# Patient Record
Sex: Female | Born: 1965 | Race: White | Hispanic: No | Marital: Married | Smoking: Never smoker
Health system: Southern US, Community
[De-identification: ages and names within clinical notes are randomized; demographics above are authoritative.]

## PROBLEM LIST (undated history)

## (undated) DIAGNOSIS — I10 Essential (primary) hypertension: Secondary | ICD-10-CM

## (undated) DIAGNOSIS — J309 Allergic rhinitis, unspecified: Secondary | ICD-10-CM

## (undated) DIAGNOSIS — U071 COVID-19: Secondary | ICD-10-CM

## (undated) DIAGNOSIS — E559 Vitamin D deficiency, unspecified: Secondary | ICD-10-CM

## (undated) HISTORY — DX: COVID-19: U07.1

## (undated) HISTORY — DX: Essential (primary) hypertension: I10

## (undated) HISTORY — DX: Allergic rhinitis, unspecified: J30.9

## (undated) HISTORY — PX: JOINT REPLACEMENT: SHX530

## (undated) HISTORY — DX: Vitamin D deficiency, unspecified: E55.9

---

## 1997-10-13 ENCOUNTER — Other Ambulatory Visit: Admission: RE | Admit: 1997-10-13 | Discharge: 1997-10-13 | Payer: Self-pay | Admitting: Obstetrics and Gynecology

## 1998-11-06 ENCOUNTER — Other Ambulatory Visit: Admission: RE | Admit: 1998-11-06 | Discharge: 1998-11-06 | Payer: Self-pay | Admitting: Obstetrics and Gynecology

## 1999-10-06 ENCOUNTER — Emergency Department (HOSPITAL_COMMUNITY): Admission: EM | Admit: 1999-10-06 | Discharge: 1999-10-06 | Payer: Self-pay

## 2000-03-19 ENCOUNTER — Other Ambulatory Visit: Admission: RE | Admit: 2000-03-19 | Discharge: 2000-03-19 | Payer: Self-pay | Admitting: Obstetrics and Gynecology

## 2000-12-15 ENCOUNTER — Other Ambulatory Visit: Admission: RE | Admit: 2000-12-15 | Discharge: 2000-12-15 | Payer: Self-pay | Admitting: Obstetrics and Gynecology

## 2001-12-23 ENCOUNTER — Other Ambulatory Visit: Admission: RE | Admit: 2001-12-23 | Discharge: 2001-12-23 | Payer: Self-pay | Admitting: Obstetrics and Gynecology

## 2002-10-11 ENCOUNTER — Inpatient Hospital Stay (HOSPITAL_COMMUNITY): Admission: AD | Admit: 2002-10-11 | Discharge: 2002-10-16 | Payer: Self-pay | Admitting: Obstetrics and Gynecology

## 2002-11-18 ENCOUNTER — Other Ambulatory Visit: Admission: RE | Admit: 2002-11-18 | Discharge: 2002-11-18 | Payer: Self-pay | Admitting: Obstetrics and Gynecology

## 2004-07-26 ENCOUNTER — Inpatient Hospital Stay (HOSPITAL_COMMUNITY): Admission: AD | Admit: 2004-07-26 | Discharge: 2004-07-26 | Payer: Self-pay | Admitting: Obstetrics & Gynecology

## 2004-08-02 ENCOUNTER — Inpatient Hospital Stay (HOSPITAL_COMMUNITY): Admission: RE | Admit: 2004-08-02 | Discharge: 2004-08-05 | Payer: Self-pay | Admitting: Obstetrics and Gynecology

## 2010-01-24 ENCOUNTER — Inpatient Hospital Stay (HOSPITAL_COMMUNITY): Admission: AD | Admit: 2010-01-24 | Discharge: 2010-01-26 | Payer: Self-pay | Admitting: Obstetrics and Gynecology

## 2010-01-25 ENCOUNTER — Encounter: Payer: Self-pay | Admitting: Obstetrics & Gynecology

## 2010-01-28 ENCOUNTER — Inpatient Hospital Stay (HOSPITAL_COMMUNITY): Admission: AD | Admit: 2010-01-28 | Discharge: 2010-01-28 | Payer: Self-pay | Admitting: Obstetrics and Gynecology

## 2010-02-02 ENCOUNTER — Encounter (INDEPENDENT_AMBULATORY_CARE_PROVIDER_SITE_OTHER): Payer: Self-pay | Admitting: Obstetrics and Gynecology

## 2010-02-02 ENCOUNTER — Inpatient Hospital Stay (HOSPITAL_COMMUNITY): Admission: AD | Admit: 2010-02-02 | Discharge: 2010-02-04 | Payer: Self-pay | Admitting: Obstetrics and Gynecology

## 2010-08-09 LAB — COMPREHENSIVE METABOLIC PANEL
ALT: 18 U/L (ref 0–35)
AST: 22 U/L (ref 0–37)
AST: 23 U/L (ref 0–37)
AST: 18 U/L (ref 0–37)
Albumin: 2.5 g/dL — ABNORMAL LOW (ref 3.5–5.2)
Albumin: 2.9 g/dL — ABNORMAL LOW (ref 3.5–5.2)
Albumin: 3 g/dL — ABNORMAL LOW (ref 3.5–5.2)
Alkaline Phosphatase: 65 U/L (ref 39–117)
BUN: 8 mg/dL (ref 6–23)
CO2: 20 mEq/L (ref 19–32)
CO2: 21 mEq/L (ref 19–32)
CO2: 21 mEq/L (ref 19–32)
Calcium: 8.6 mg/dL (ref 8.4–10.5)
Calcium: 8.9 mg/dL (ref 8.4–10.5)
Chloride: 105 mEq/L (ref 96–112)
Creatinine, Ser: 0.6 mg/dL (ref 0.4–1.2)
Creatinine, Ser: 0.68 mg/dL (ref 0.4–1.2)
Creatinine, Ser: 0.6 mg/dL (ref 0.4–1.2)
GFR calc Af Amer: >60 mL/min (ref >60)
GFR calc Af Amer: >60 mL/min (ref >60)
GFR calc Af Amer: >60 mL/min (ref >60)
GFR calc non Af Amer: >60 mL/min (ref >60)
GFR calc non Af Amer: >60 mL/min (ref >60)
GFR calc non Af Amer: >60 mL/min (ref >60)
Glucose, Bld: 93 mg/dL (ref 70–99)
Potassium: 4 mEq/L (ref 3.5–5.1)
Sodium: 134 mEq/L — ABNORMAL LOW (ref 135–145)
Total Bilirubin: 0.3 mg/dL (ref 0.3–1.2)
Total Bilirubin: 0.4 mg/dL (ref 0.3–1.2)
Total Protein: 5.6 g/dL — ABNORMAL LOW (ref 6.0–8.3)
Total Protein: 5.7 g/dL — ABNORMAL LOW (ref 6.0–8.3)
Total Protein: 6.5 g/dL (ref 6.0–8.3)

## 2010-08-09 LAB — CREATININE CLEARANCE, URINE, 24 HOUR
Collection Interval-CRCL: 24 hours
Creatinine, 24H Ur: 1459 mg/d (ref 700–1800)
Creatinine: 0.6 mg/dL (ref 0.4–1.2)
Urine Total Volume-CRCL: 1950 mL

## 2010-08-09 LAB — CBC
HCT: 31.5 % — ABNORMAL LOW (ref 36.0–46.0)
HCT: 31.8 % — ABNORMAL LOW (ref 36.0–46.0)
Hemoglobin: 11.1 g/dL — ABNORMAL LOW (ref 12.0–15.0)
Hemoglobin: 11.4 g/dL — ABNORMAL LOW (ref 12.0–15.0)
MCH: 31.6 pg (ref 26.0–34.0)
MCH: 32 pg (ref 26.0–34.0)
MCH: 32 pg (ref 26.0–34.0)
MCH: 31.5 pg (ref 26.0–34.0)
MCHC: 34.5 g/dL (ref 30.0–36.0)
MCHC: 34.7 g/dL (ref 30.0–36.0)
MCHC: 34.1 g/dL (ref 30.0–36.0)
MCV: 91 fL (ref 78.0–100.0)
MCV: 91.7 fL (ref 78.0–100.0)
MCV: 92.2 fL (ref 78.0–100.0)
Platelets: 149 10*3/uL — ABNORMAL LOW (ref 150–400)
Platelets: 164 10*3/uL (ref 150–400)
Platelets: 168 10*3/uL (ref 150–400)
Platelets: 206 10*3/uL (ref 150–400)
RBC: 3.53 MIL/uL — ABNORMAL LOW (ref 3.87–5.11)
RDW: 13.1 % (ref 11.5–15.5)
RDW: 13.2 % (ref 11.5–15.5)
RDW: 13.5 % (ref 11.5–15.5)
RDW: 13.1 % (ref 11.5–15.5)
WBC: 8.6 10*3/uL (ref 4.0–10.5)

## 2010-08-09 LAB — URINE MICROSCOPIC-ADD ON

## 2010-08-09 LAB — URIC ACID: Uric Acid, Serum: 7.4 mg/dL — ABNORMAL HIGH (ref 2.4–7.0)

## 2010-08-09 LAB — URINALYSIS, ROUTINE W REFLEX MICROSCOPIC
Glucose, UA: NEGATIVE mg/dL
Leukocytes, UA: NEGATIVE
Protein, ur: NEGATIVE mg/dL
Specific Gravity, Urine: 1.01 (ref 1.005–1.030)
Urobilinogen, UA: 0.2 mg/dL (ref 0.0–1.0)

## 2010-08-09 LAB — PROTEIN, URINE, 24 HOUR
Collection Interval-UPROT: 24 hours
Protein, Urine: 6 mg/dL

## 2010-08-09 LAB — LACTATE DEHYDROGENASE: LDH: 124 U/L (ref 94–250)

## 2010-10-12 NOTE — Discharge Summary (Signed)
   NAME:  LASHAWNDA, HANCOX                             ACCOUNT NO.:  [id number]   MEDICAL RECORD NO.:  [id number]                   PATIENT TYPE:  INP   LOCATION:  9102                                 FACILITY:  [facility]   PHYSICIAN:  Lenoard Aden, M.D.             DATE OF BIRTH:  1965/06/23   DATE OF ADMISSION:  10/12/2002  DATE OF DISCHARGE:  10/16/2002                                 DISCHARGE SUMMARY   HISTORY OF PRESENT ILLNESS:  The patient was admitted for mild preeclampsia  at term, for induction. She was induced and progressed to full dilatation at  which point failure to descend was noted. C-section was performed.  Uncomplicated primary C-section was performed on Oct 13, 2002. Postoperative  course uncomplicated. Hemoglobin 7.9, repeat was stable. The patient is  asymptomatic.   DISPOSITION:  Discharged to home day 3.   DISCHARGE MEDICATIONS:  1. Prenatal vitamins.  2. Iron.  3. Tylox given.   FOLLOW UP:  In the office in 4-6 weeks. Discharge teaching done.                                               Lenoard Aden, M.D.    RJT/MEDQ  D:  11/21/2002  T:  11/21/2002  Job:  [id number]

## 2010-10-12 NOTE — Op Note (Signed)
NAME:  Morgan Gonzalez, Morgan Gonzalez                 ACCOUNT NO.:  [id number]   MEDICAL RECORD NO.:  [id number]          PATIENT TYPE:  INP   LOCATION:  9198                          FACILITY:  [facility]   PHYSICIAN:  Maxie Better, M.D.DATE OF BIRTH:  1966-01-03   DATE OF PROCEDURE:  08/02/2004  DATE OF DISCHARGE:                                 OPERATIVE REPORT   PREOPERATIVE DIAGNOSES:  1.  Previous cesarean section.  2.  Term gestation.   PROCEDURE:  1.  Repeat cesarean section.  2.  Removal of previous keloid scar.   POSTOPERATIVE DIAGNOSES:  1.  Previous cesarean section.  2.  Term gestation.   ANESTHESIA:  Spinal.   SURGEON:  Maxie Better, M.D.   ASSISTANT:  Genia Del, M.D.   DESCRIPTION OF PROCEDURE:  Under adequate spinal anesthesia, the patient was  placed in a supine position with a left lateral tilt.  She was sterilely  prepped and draped in the usual fashion.  An indwelling Foley catheter was  placed.  Marcaine 10 mL of 0.25% was injected along the previous keloid  scar.  Pfannenstiel skin incision was made above the scar, carried down to  the rectus fascia, rectus fascia incised in the midline and extended  bilaterally to the rectus fascia.  Fascia was then bluntly and sharply  dissected off the rectus muscle in superior and inferior fashion.  On the  dissection superiorly, the parietal peritoneum was noted with the rectus  muscle partially separated superiorly.  A careful dissection was then  performed.  The parietal peritoneum was opened superiorly, and the remainder  of the rectus fascia was then bluntly dissected off the rectus muscle  superiorly.  An omental adhesion of the anterior abdominal wall was lysed.  The parietal peritoneum was further opened inferiorly, and the vesicouterine  peritoneum was developed.  The bladder was then bluntly dissected off the  lower uterine segment.  A curvilinear low transverse uterine incision was  then made and extended  bilaterally using bandage scissors.  Artificial  rupture of membranes was performed, clear fluid noted.  Subsequent delivery  of a live female utilizing the vacuum was then accomplished.  Baby was bulb-  suctioned on the abdomen; cord was clamped, cut; the baby was transferred to  the awaiting pediatrician who assigned Apgars of 9 and 10 at one and five  minutes.  The cord was noted to be short.  It was 2 cm on the fetal side and  about  4 inches on the placental side.  The placenta was spontaneous,  intact, not sent to pathology.  Uterine cavity was cleaned of debris.  Uterine incision was without extension and closed in two layers, the first  layer with 0 Monocryl running lock stitch; the second layer was imbricated  using 0 Monocryl suture.  A small bleeder on the right superior aspect of  the incision was hemostased with a figure-of-eight 0 Monocryl suture.  Normal tubes and ovaries were noted bilaterally.  The abdomen was copiously  irrigated, suctioned of debris.  Inspection of the undersurface of the  rectus fascia, small  bleeders cauterized, the rectus muscle area small  bleeder cauterized.  The area from which the omentum was removed was  cauterized for good hemostasis.  With good hemostasis noted, the rectus  fascia was closed with 0 Vicryl x 2.  At that point, the old keloid scar was  then placed on traction using Allises and then removed.  The underlying  subcutaneous area was undermined, subcutaneous areas irrigated and  suctioned, small bleeders cauterized.  The subcutaneous area was then  approximated using 3-0 plain interrupted suture, and then the skin was  approximated using Ethibond staples.  Specimen was placenta not sent.  Estimated blood loss was 600 mL.  Urine output was 250 mL clear-yellow  urine.  Intraoperative fluid was 3 L.  Sponge and instrument counts x 2 was  correct.  Complication was none.  Weight of the baby was 8 pounds 3 ounces.  The patient tolerated the  procedure well and was transferred to recovery  room in stable condition.      [place]/MEDQ  D:  08/02/2004  T:  08/02/2004  Job:  [id number]

## 2010-10-12 NOTE — Op Note (Signed)
NAME:  Morgan Gonzalez, Morgan Gonzalez                             ACCOUNT NO.:  [id number]   MEDICAL RECORD NO.:  [id number]                   PATIENT TYPE:  INP   LOCATION:  9174                                 [facility]ACILITY:  [facility]   PHYSICIAN:  Lenoard Aden, M.D.             DATE OF BIRTH:  1966-04-09   DATE OF PROCEDURE:  10/13/2002  DATE OF DISCHARGE:                                 OPERATIVE REPORT   PREOPERATIVE DIAGNOSES:  Failure to descend, 39 week intrauterine pregnancy,  gestational hypertension.   POSTOPERATIVE DIAGNOSES:  Failure to descend,  39 week intrauterine  pregnancy, gestational hypertension.   PROCEDURE:  Primary lower segment transverse cesarean section   SURGEON:  Lenoard Aden, M.D.   ASSISTANT:  Cordelia Pen A. Rosalio Macadamia, M.D.   ESTIMATED BLOOD LOSS:  500 cc.   COMPLICATIONS:  None.   DRAINS:  Foley.   COUNTS:  Correct.   Patient went to recovery in good condition.   FINDINGS:  Full term living female, occiput posterior position, Apgars 8 and  9, placenta manually intact, three-vessel cord, normal tubes and ovaries.   DESCRIPTION OF PROCEDURE:  The patient was taken to the operating room and  after being apprised of risks of anesthesia, infection, bleeding,  intraabdominal injury, need for repair, the patient was brought to the  operating room where she was administered epidural anesthetic without  complications, prepped and draped in usual sterile fashion.  Foley catheter  placed.  After achieving adequate anesthesia a Pfannenstiel skin incision  made after placement of a dilute Marcaine solution, carried down to the  fascia which was nicked in the midline, opened transversely using Mayo  scissors.  Rectus muscles entered sharply in midline.  Peritoneum entered  sharply and bladder blade placed.  Distal peritoneum was scored in a smile-  like fashion, uterus scored in a smile-like fashion.  Atraumatic delivery  from an occiput posterior position of a full term  living female, Apgars 8 and  9, handed to the pediatrician in attendance.  Cord blood collected.  Placenta delivered manually intact.  Uterus exteriorized.  Normal tubes and  ovaries.  Uterus curetted using dry lap pack, closed in two layers using 0  Monocryl suture.  Good hemostasis achieved.  Bladder flap inspected.  Pericolic gutters irrigated with all blood clots subsequently removed.  Good  hemostasis noted.  Nesacaine solution, 2%, is placed intra-abdominally per  request of Anesthesia,  approximately 10 cc.  At this time good hemostasis was noted.  Fascia was  closed using 0 Monocryl in continuous running fashion.  Skin closed using  staples.  The patient tolerated the procedure well and was to recovery in  good condition.  Lenoard Aden, M.D.    RJT/MEDQ  D:  10/13/2002  T:  10/13/2002  Job:  [id number]

## 2010-10-12 NOTE — Discharge Summary (Signed)
NAME:  Morgan Gonzalez, Morgan Gonzalez                 ACCOUNT NO.:  [id number]   MEDICAL RECORD NO.:  [id number]          PATIENT TYPE:  INP   LOCATION:  9137                          [facility]ACILITY:  [facility]   PHYSICIAN:  Maxie Better, M.D.DATE OF BIRTH:  1965-06-10   DATE OF ADMISSION:  08/02/2004  DATE OF DISCHARGE:  08/05/2004                                 DISCHARGE SUMMARY   ADMISSION DIAGNOSES:  1.  Previous cesarean section.  2.  Term gestation.  3.  Keloid scar.   DISCHARGE DIAGNOSES:  1.  Term gestation, delivered.  2.  Repeat cesarean section.  3.  Removal of a keloid scar.  4.  Postoperative anemia.   PROCEDURE:  Repeat cesarean section, keloid scar removal.   HISTORY OF PRESENT ILLNESS:  A 45 year old gravida 2, para 1-0-0-1, with a  previous cesarean section, admitted at term for repeat cesarean section.   HOSPITAL COURSE:  The patient was admitted to [facility].  She was  taken to the operating room where she underwent a repeat cesarean section,  resulted in an 8 pound 3 ounce female infant, Apgars of 9 and 10.  Findings at  the time of surgery were normal tubes and ovaries, omental adhesions that  were lysed.  Postoperative course was notable for early cellulitis for which  she was placed on Keflex after the incision had been probed with a sterile Q-  tip.  By postoperative day #3, the incision had some mild ecchymosis, no  evidence of infection.  She was tolerating a regular diet.  She had some  elevation of her blood pressure for which PIH labs were drawn which were  unremarkable.  Her CBC on postoperative day #1 had shown a white count of  9.7, hemoglobin 12.1.  She was deemed well to be discharged home.   DISPOSITION:  Home.   DISCHARGE MEDICATIONS:  1.  Keflex 500 mg one p.o. q.6h for 7 days.  2.  Dilaudid one to two tablets every 3 to 4 hours p.r.n. pain.  3.  Prenatal vitamins one p.o. daily.   FOLLOW UP:  Follow-up appointment is at [facility] in a weeks' time  and  6 weeks postpartum.   DISCHARGE INSTRUCTIONS:  Discharge instructions per postpartum booklet  given.      Leslie/MEDQ  D:  08/26/2004  T:  08/27/2004  Job:  [id number]

## 2011-01-24 IMAGING — US US OB COMP +14 WK
1 series · 18 of 28 positions shown · non-contrast
Comparison: none

OBSTETRICAL ULTRASOUND:
 This ultrasound was performed in The [HOSPITAL], and the [facility] report will be stored to [REDACTED] PACS.  This report is also available in [HOSPITAL]?s accessANYware.

[Series 1: us ob comp +14 wk · 46 acquisitions, 18 frames shown]
[im 1/46]
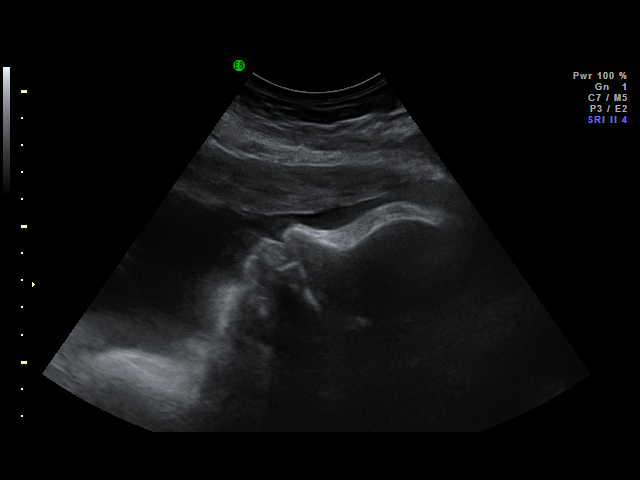
[im 4/46]
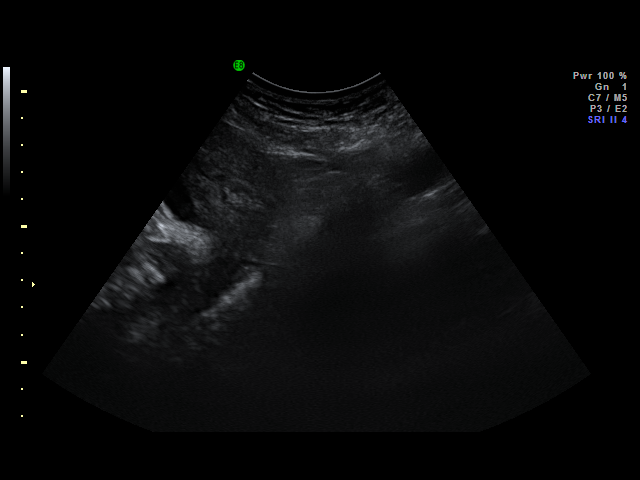
[im 6/46]
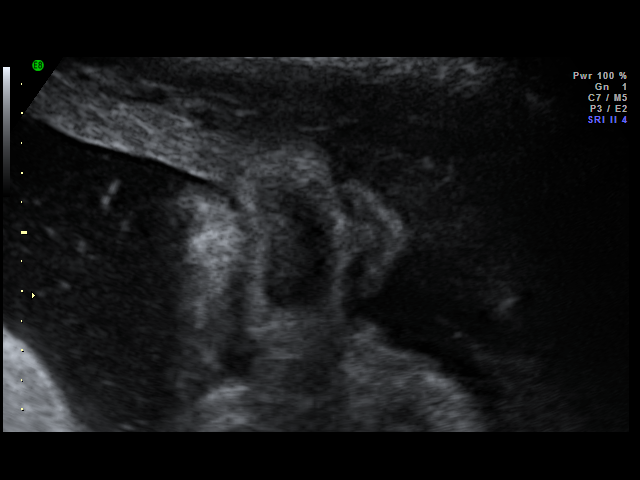
[im 9/46]
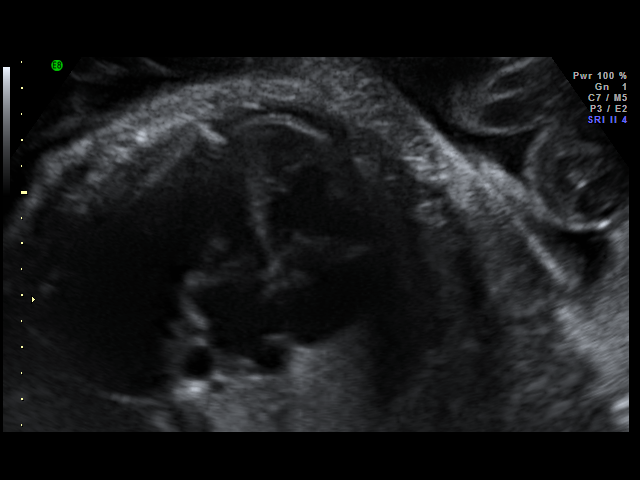
[im 12/46]
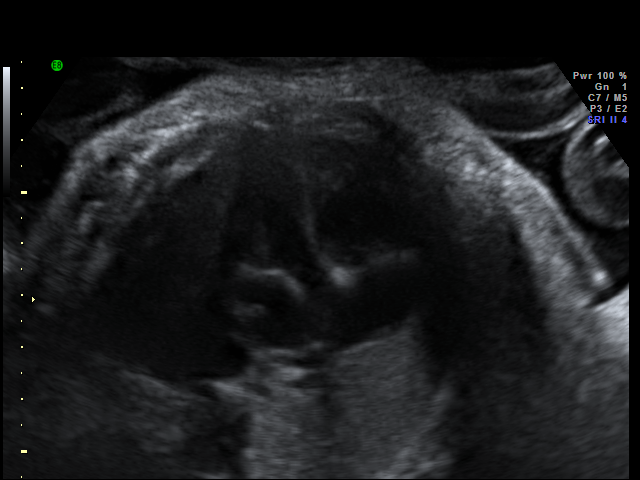
[im 14/46]
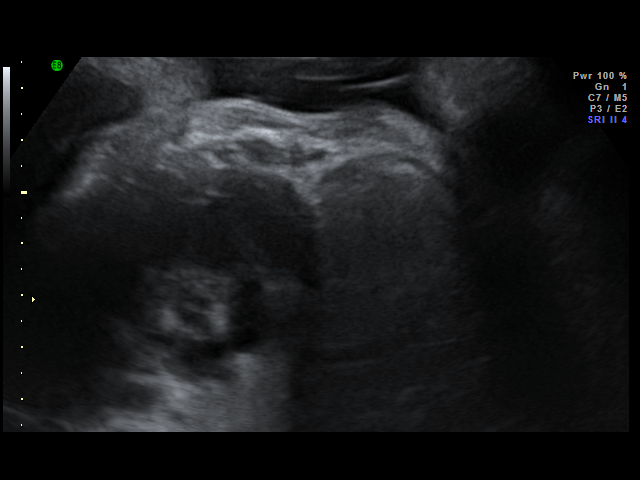
[im 17/46]
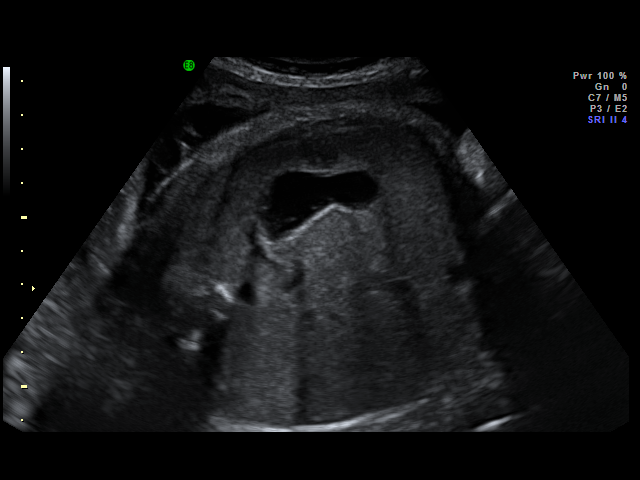
[im 19/46]
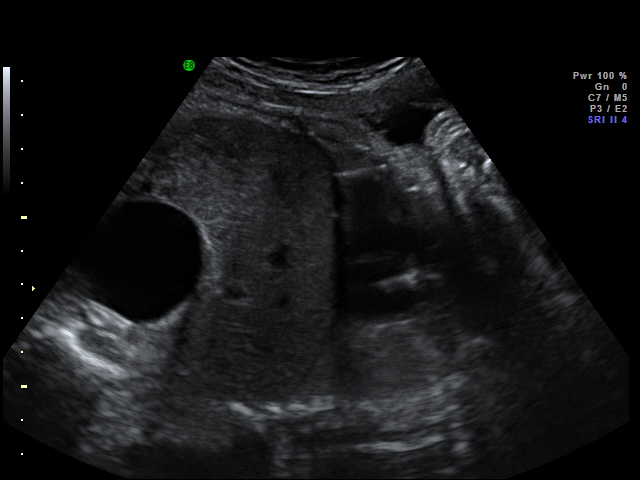
[im 22/46]
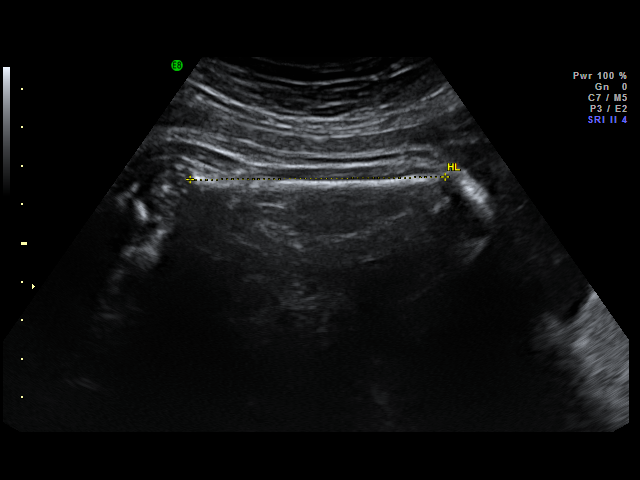
[im 24/46]
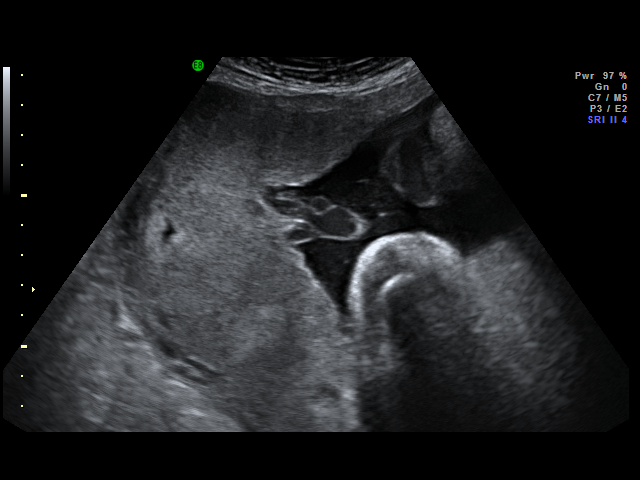
[im 27/46]
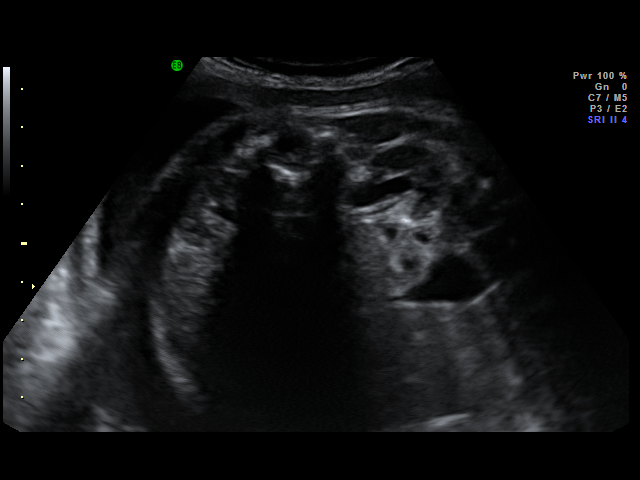
[im 29/46]
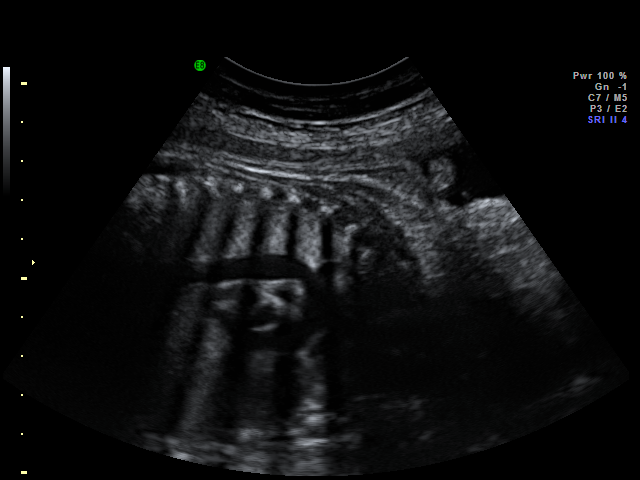
[im 32/46]
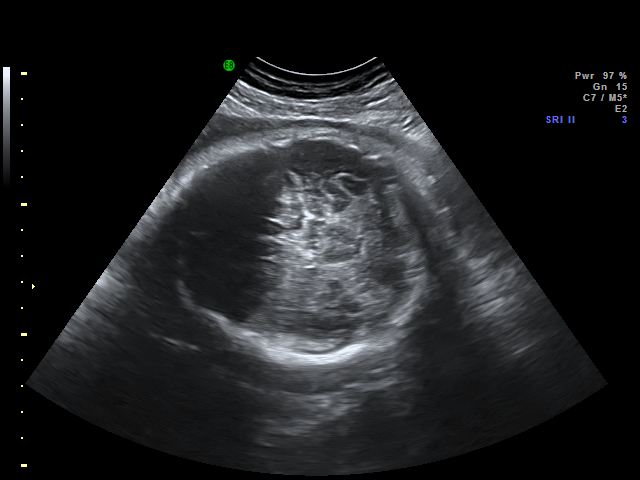
[im 36/46]
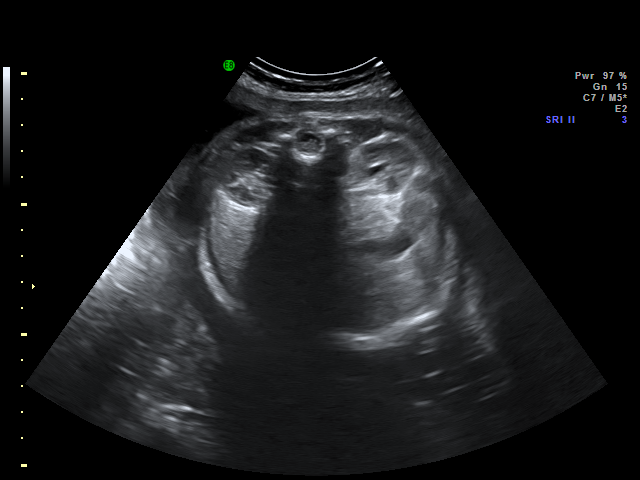
[im 37/46]
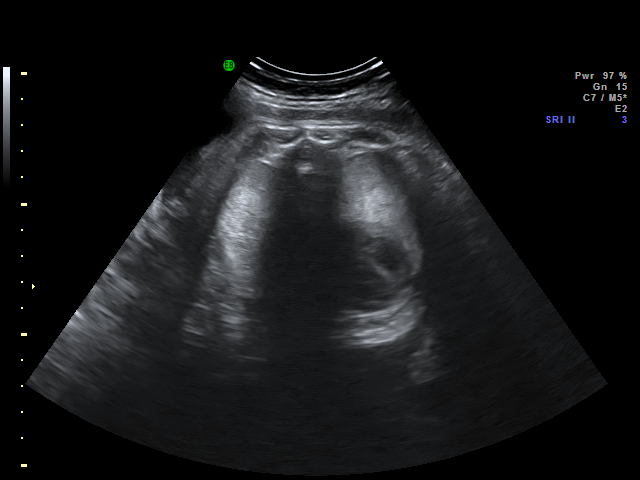
[im 41/46]
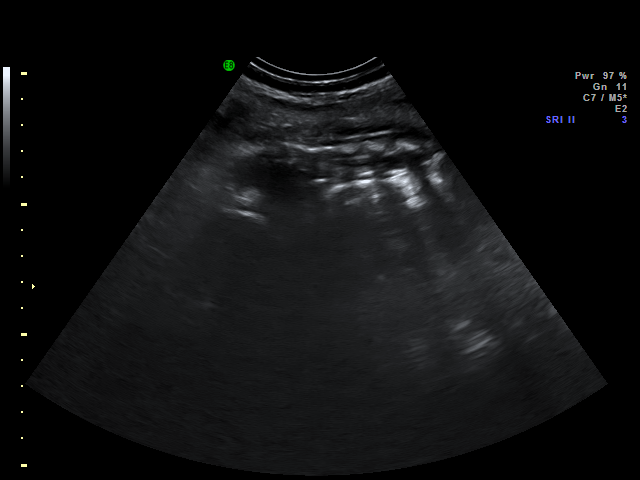
[im 42/46]
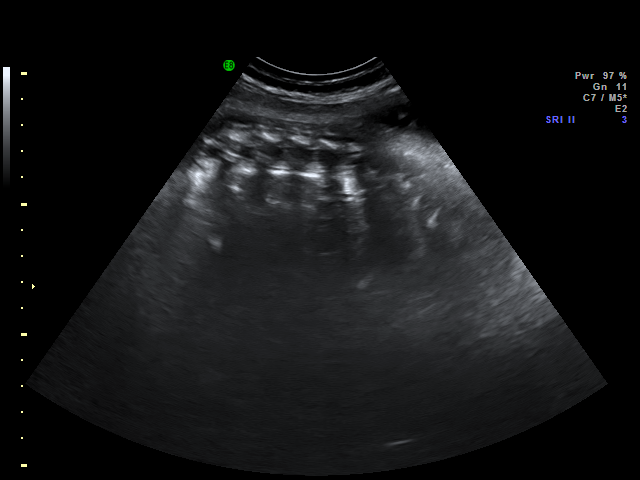
[im 46/46]
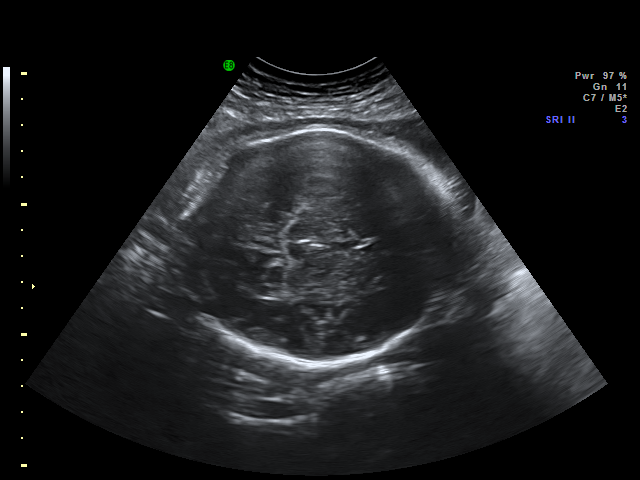

[18 of 28 positions shown; findings below may reference images not displayed]

IMPRESSION: [facility] has also been faxed to the ordering physician.

## 2015-01-13 ENCOUNTER — Encounter: Payer: Self-pay | Admitting: Family Medicine

## 2015-01-13 ENCOUNTER — Ambulatory Visit: Payer: Self-pay | Admitting: Family Medicine

## 2015-01-13 ENCOUNTER — Ambulatory Visit (INDEPENDENT_AMBULATORY_CARE_PROVIDER_SITE_OTHER): Payer: Managed Care, Other (non HMO) | Admitting: Family Medicine

## 2015-01-13 ENCOUNTER — Other Ambulatory Visit (HOSPITAL_COMMUNITY)
Admission: RE | Admit: 2015-01-13 | Discharge: 2015-01-13 | Disposition: A | Discharge status: Home / Self Care | Payer: Managed Care, Other (non HMO) | Source: Ambulatory Visit | Attending: Family Medicine | Admitting: Family Medicine

## 2015-01-13 VITALS — BP 124/82 | HR 100 | Temp 98.5°F | Ht 63.35 in | Wt 171.8 lb

## 2015-01-13 DIAGNOSIS — Z13220 Encounter for screening for lipoid disorders: Secondary | ICD-10-CM

## 2015-01-13 DIAGNOSIS — N760 Acute vaginitis: Secondary | ICD-10-CM | POA: Insufficient documentation

## 2015-01-13 DIAGNOSIS — IMO0001 Reserved for inherently not codable concepts without codable children: Secondary | ICD-10-CM

## 2015-01-13 DIAGNOSIS — Z113 Encounter for screening for infections with a predominantly sexual mode of transmission: Secondary | ICD-10-CM | POA: Insufficient documentation

## 2015-01-13 DIAGNOSIS — N9489 Other specified conditions associated with female genital organs and menstrual cycle: Secondary | ICD-10-CM

## 2015-01-13 DIAGNOSIS — J3489 Other specified disorders of nose and nasal sinuses: Secondary | ICD-10-CM

## 2015-01-13 DIAGNOSIS — R102 Pelvic and perineal pain: Secondary | ICD-10-CM | POA: Diagnosis not present

## 2015-01-13 DIAGNOSIS — G4710 Hypersomnia, unspecified: Secondary | ICD-10-CM

## 2015-01-13 DIAGNOSIS — R0981 Nasal congestion: Secondary | ICD-10-CM

## 2015-01-13 DIAGNOSIS — R030 Elevated blood-pressure reading, without diagnosis of hypertension: Secondary | ICD-10-CM

## 2015-01-13 LAB — POCT URINALYSIS DIPSTICK
Bilirubin, UA: NEGATIVE
GLUCOSE UA: NEGATIVE
KETONES UA: NEGATIVE
Leukocytes, UA: NEGATIVE
Nitrite, UA: NEGATIVE
SPEC GRAV UA: 1.02
UROBILINOGEN UA: 0.2
pH, UA: 5.5

## 2015-01-13 LAB — COMPREHENSIVE METABOLIC PANEL
ALK PHOS: 64 U/L (ref 39–117)
ALT: 57 U/L — AB (ref 0–35)
AST: 20 U/L (ref 0–37)
Albumin: 4.1 g/dL (ref 3.5–5.2)
BILIRUBIN TOTAL: 0.4 mg/dL (ref 0.2–1.2)
BUN: 11 mg/dL (ref 6–23)
CALCIUM: 9.6 mg/dL (ref 8.4–10.5)
CO2: 29 mEq/L (ref 19–32)
Chloride: 104 mEq/L (ref 96–112)
Creatinine, Ser: 0.67 mg/dL (ref 0.40–1.20)
GFR: 100.1 mL/min (ref >60.00)
GLUCOSE: 81 mg/dL (ref 70–99)
POTASSIUM: 5 meq/L (ref 3.5–5.1)
Sodium: 140 mEq/L (ref 135–145)
TOTAL PROTEIN: 7.4 g/dL (ref 6.0–8.3)

## 2015-01-13 LAB — FOLLICLE STIMULATING HORMONE: FSH: 13.3 m[IU]/mL

## 2015-01-13 LAB — CBC
HCT: 43.3 % (ref 36.0–46.0)
HEMOGLOBIN: 14.7 g/dL (ref 12.0–15.0)
MCHC: 33.9 g/dL (ref 30.0–36.0)
MCV: 87.2 fl (ref 78.0–100.0)
PLATELETS: 282 10*3/uL (ref 150.0–400.0)
RBC: 4.97 Mil/uL (ref 3.87–5.11)
RDW: 12.6 % (ref 11.5–15.5)
WBC: 10.5 10*3/uL (ref 4.0–10.5)

## 2015-01-13 LAB — LIPID PANEL
Cholesterol: 205 mg/dL — ABNORMAL HIGH (ref 0–200)
HDL: 46.7 mg/dL (ref >39.00)
NONHDL: 158.67
Total CHOL/HDL Ratio: 4
Triglycerides: 206 mg/dL — ABNORMAL HIGH (ref 0.0–149.0)
VLDL: 41.2 mg/dL — ABNORMAL HIGH (ref 0.0–40.0)

## 2015-01-13 LAB — URINALYSIS, MICROSCOPIC ONLY

## 2015-01-13 LAB — POCT URINE PREGNANCY: PREG TEST UR: NEGATIVE

## 2015-01-13 LAB — LDL CHOLESTEROL, DIRECT: LDL DIRECT: 135 mg/dL

## 2015-01-13 MED ORDER — MOMETASONE FUROATE 50 MCG/ACT NA SUSP
NASAL | Status: DC
Start: 2015-01-13 — End: 2018-12-10

## 2015-01-13 NOTE — Progress Notes (Signed)
Pre visit review using our clinic review tool, if applicable. No additional management support is needed unless otherwise documented below in the visit note. 

## 2015-01-13 NOTE — Patient Instructions (Signed)
Nice to see you. We will check some blood work and call with the results.  We will start you on nasonex for nasal congestion. Please use nasal saline as well. If you have recurrence of the vertigo or if you develop headache, numbness, weakness, vision changes, fever, neck pain, shortness of breath or chest pain please seek medical attention.  If you develop abdominal pain, nausea, vomiting, or diarrhea please seek medical attention.  We will send you for a sleep study as well.

## 2015-01-13 NOTE — Progress Notes (Signed)
Patient ID: Morgan Gonzalez, female   DOB: 04/03/1966, 49 y.o.   MRN: [id number]  Tommi Rumps, MD Phone: [phone]  Morgan Gonzalez is a 49 y.o. female who presents today for new patient visit.  Sinus pressure: patient notes Wednesday she started with sinus congestion. Congestion is described as pressure in maxillary sinuses. Notes sore throat as well. Had episode of vertigo with change in position with this on Wednesday as well. Notes that her vision went back and forth with this vertigo sensation. No blurry vision. Notes vertigo and shaking vision lasted for 2 minutes immediately after laying down and then resolved on its own with staying in the same position. No vision issues after vertigo stopped. Notes conitnued sinus pressure. No more vertigo. No additional vision issues. No weakness or numbness. No fevers. No neck pain or stiffness. Notes this feels like her typical sinus issues.  Nasal congestion while sleeping: notes that she has trouble breathing out of her nose only when she is laying down while asleep. Notes that her nose feels congested with this. Is able to breath out of mouth with this, though minimally congested with this. No dyspnea, no orthopnea, no chest pain, no dyspnea on exertion. Notes she snores. Denies persistent LE edema. No witnessed apnea. Reports not being well rested in the morning. Will doze off in front of TV, though not in the car. Has history of allergic rhinitis and previously used nasal steroid though stopped this. No rhinorrhea.   Missed period: patient reports that she has been late for her cycle the past 2 months. Last month she was 8 days late. Now it has been 23 days since her last period. Notes some hot flashes previously though none recently. Notes mild pressure sensation in bilateral lower pelvis with this. No urinary symptoms. No vaginal discharge. Has history of tubal ligation. Not currently sexually active. Unsure of last sexual activity. No nausea or vomiting.  Notes she has diarrhea with her periods and has had this intermittently over the past month.   History of elevated BP: notes she had pre-eclampsia during pregnancy. Since then BP typically does not get over 134/87. No CP or dyspnea. Has not previously been on medication.   Active Ambulatory Problems    Diagnosis Date Noted  . Pelvic pressure in female 01/13/2015  . Sinus pressure 01/16/2015  . Nasal congestion while sleeping 01/16/2015  . Elevated blood pressure 01/16/2015   Resolved Ambulatory Problems    Diagnosis Date Noted  . No Resolved Ambulatory Problems   Past Medical History  Diagnosis Date  . Hypertension   . Allergic rhinitis    Family History  Problem Relation Age of Onset  . Stroke Mother   . Hypertension Mother   . Diabetes Mother   . Hypertension Father   . Hyperlipidemia Father   . Hyperlipidemia Mother   . Diabetes Mother   . Brain cancer Father    Social History   Social History  . Marital Status: Married    Spouse Name: N/A  . Number of Children: N/A  . Years of Education: N/A   Occupational History  . Not on file.   Social History Main Topics  . Smoking status: Never Smoker   . Smokeless tobacco: Not on file  . Alcohol Use: 0.6 oz/week    1 Standard drinks or equivalent per week  . Drug Use: No  . Sexual Activity:    Partners: Male   Other Topics Concern  . Not on file   Social  History Narrative     Review of Systems  Constitutional: Negative for fever, weight loss, malaise/fatigue and diaphoresis.  HENT: Positive for congestion (sinus pressure) and sore throat. Negative for hearing loss and tinnitus.   Eyes: Negative for blurred vision, double vision and photophobia.       Positive for single episode shaking vision with vertigo and change in head position, no double vision or blurry vision  Respiratory: Negative for cough and shortness of breath.   Cardiovascular: Negative for chest pain, palpitations, orthopnea and leg swelling.    Gastrointestinal: Positive for abdominal pain (lower pelvic discomfort), diarrhea and constipation. Negative for nausea, vomiting and blood in stool.  Genitourinary: Negative for dysuria, urgency, frequency and hematuria.  Musculoskeletal: Negative for myalgias, joint pain and neck pain.  Neurological: Positive for dizziness (vertigo on change of position 2 days ago, none now), weakness and headaches (sinus pressure). Negative for sensory change and focal weakness.  Psychiatric/Behavioral: Negative for depression.       Positive for stress    Objective  Physical Exam Filed Vitals:   01/13/15 1001  BP: 124/82  Pulse:   Temp:     Physical Exam  Constitutional: She is well-developed, well-nourished, and in no distress.  HENT:  Head: Normocephalic and atraumatic.  Right Ear: External ear normal.  Left Ear: External ear normal.  Mild posterior OP erythema, normal TMs bilaterally, no cervical LAD  Eyes: Conjunctivae and EOM are normal. Pupils are equal, round, and reactive to light. Right eye exhibits no discharge. Left eye exhibits no discharge.  Neck: Neck supple.  Cardiovascular: Normal rate.  Exam reveals no gallop and no friction rub.   No murmur heard. Pulmonary/Chest: Effort normal and breath sounds normal. No respiratory distress. She has no wheezes. She has no rales.  Abdominal: Soft. Bowel sounds are normal. She exhibits no distension and no mass. There is no tenderness. There is no rebound and no guarding.  Genitourinary: Uterus normal, cervix normal, right adnexa normal and left adnexa normal. Vaginal discharge (creamy white) found.  Lymphadenopathy:    She has no cervical adenopathy.  Neurological: She is alert.  CN 2-12 intact, 5/5 strength in bilateral biceps, triceps, grip, quads, hamstrings, plantar and dorsiflexion, sensation to light touch intact in bilateral UE and LE, normal gait, 2+ patellar and brachioradialis reflexes, negative rhomberg, no pronator drift,  normal finger to nose, negative dix-halpike  Skin: Skin is warm and dry. She is not diaphoretic.     Assessment/Plan: Please see individual problem list.  Pelvic pressure in female Patient with missed period this month and sensation of pelvic pressure. Normal abdominal exam. Pelvic exam with small amount of discharge, though no masses palpated, tenderness, or cervical motion tenderness. UA with trace blood though only 0-2 RBC on micro. U preg negative. Will check cervicovaginal ancillary testing and FSH. Discussed US pelvis to evaluate pressure sensation if work up is negative. Given return precautions.   Sinus pressure Patient with sinus congestion and pressure for several days consistent with viral URI. Vertigo likely related to viral illness and no evidence of vertigo today on exam. Also with normal neurological testing. Negative dix-halpike, though per history vertigo with what sounds like nystagmus (shaking vision back and forth) could be consistent with BPPV. Will start on nasonex. Patient to use nasal saline as well. If vertigo recurs or she develops vision changes again patient is to call the office. Given return precautions.   Nasal congestion while sleeping Reports nasal congestion while sleeping that results in  difficulty breathing out of nose. Notes being not well rested in am and easily falling asleep while watching TV. Snores, though no noted apnea. No CHF complaints. Likely this represents some measure of allergic rhinitis, though with snoring and sleepiness, there is some concern that there could be some measure of OSA. Will start on nasonex and obtain a sleep study to evaluate further. Given return precautions.   Elevated blood pressure First reading of BP with elevated diastolic, though on recheck is in normal range. Has history of pre-eclampsia. No prior medications for BP. Discussed goal is 140/90 or less. Advised to monitor BP at home and if higher than this to let us know. Will  have f/u in one month and check BP at that time. Lipid panel and CMET checked.     Orders Placed This Encounter  Procedures  . Comp Met (CMET)  . CBC  . [place]  . Urine Microscopic Only  . Lipid Profile    Standing Status: Future     Number of Occurrences: 1     Standing Expiration Date: 01/13/2016  . LDL cholesterol, direct  . POCT Urinalysis Dipstick  . POCT urine pregnancy  . Split night study    Standing Status: Future     Number of Occurrences:      Standing Expiration Date: 01/13/2016    Order Specific Question:  Where should this test be performed:    Answer:  [facility]

## 2015-01-16 ENCOUNTER — Encounter: Payer: Self-pay | Admitting: Family Medicine

## 2015-01-16 DIAGNOSIS — J3489 Other specified disorders of nose and nasal sinuses: Secondary | ICD-10-CM | POA: Insufficient documentation

## 2015-01-16 DIAGNOSIS — R0981 Nasal congestion: Secondary | ICD-10-CM | POA: Insufficient documentation

## 2015-01-16 DIAGNOSIS — R030 Elevated blood-pressure reading, without diagnosis of hypertension: Secondary | ICD-10-CM

## 2015-01-16 DIAGNOSIS — IMO0001 Reserved for inherently not codable concepts without codable children: Secondary | ICD-10-CM | POA: Insufficient documentation

## 2015-01-16 NOTE — Assessment & Plan Note (Signed)
Reports nasal congestion while sleeping that results in difficulty breathing out of nose. Notes being not well rested in am and easily falling asleep while watching TV. Snores, though no noted apnea. No CHF complaints. Likely this represents some measure of allergic rhinitis, though with snoring and sleepiness, there is some concern that there could be some measure of OSA. Will start on nasonex and obtain a sleep study to evaluate further. Given return precautions.

## 2015-01-16 NOTE — Assessment & Plan Note (Signed)
Patient with missed period this month and sensation of pelvic pressure. Normal abdominal exam. Pelvic exam with small amount of discharge, though no masses palpated, tenderness, or cervical motion tenderness. UA with trace blood though only 0-2 RBC on micro. U preg negative. Will check cervicovaginal ancillary testing and FSH. Discussed US pelvis to evaluate pressure sensation if work up is negative. Given return precautions.

## 2015-01-16 NOTE — Assessment & Plan Note (Addendum)
First reading of BP with elevated diastolic, though on recheck is in normal range. Has history of pre-eclampsia. No prior medications for BP. Discussed goal is 140/90 or less. Advised to monitor BP at home and if higher than this to let us know. Will have f/u in one month and check BP at that time. Lipid panel and CMET checked.

## 2015-01-16 NOTE — Assessment & Plan Note (Addendum)
Patient with sinus congestion and pressure for several days consistent with viral URI. Vertigo likely related to viral illness and no evidence of vertigo today on exam. Also with normal neurological testing. Negative dix-halpike, though per history vertigo with what sounds like nystagmus (shaking vision back and forth) could be consistent with BPPV. Will start on nasonex. Patient to use nasal saline as well. If vertigo recurs or she develops vision changes again patient is to call the office. Given return precautions.

## 2015-01-17 ENCOUNTER — Telehealth: Payer: Self-pay | Admitting: Family Medicine

## 2015-01-17 ENCOUNTER — Ambulatory Visit: Payer: Self-pay | Admitting: Nurse Practitioner

## 2015-01-17 DIAGNOSIS — R809 Proteinuria, unspecified: Secondary | ICD-10-CM

## 2015-01-17 DIAGNOSIS — R740 Nonspecific elevation of levels of transaminase and lactic acid dehydrogenase [LDH]: Principal | ICD-10-CM

## 2015-01-17 DIAGNOSIS — R7401 Elevation of levels of liver transaminase levels: Secondary | ICD-10-CM

## 2015-01-17 LAB — CERVICOVAGINAL ANCILLARY ONLY
CHLAMYDIA, DNA PROBE: NEGATIVE
NEISSERIA GONORRHEA: NEGATIVE
Trichomonas: NEGATIVE

## 2015-01-17 NOTE — Telephone Encounter (Signed)
Called patient to discuss lab work. Advised on mildly elevated ALT and discussed potential causes of this (alcohol, viral, fatty liver) and advised that I would like to repeat this. If it remains elevated we would consider work up with further blood work and [place]. Advised of small amount of protein on UA and need to repeat this to confirm and potential further work up if this remains positive. Advised of other normal labs. Future orders placed and patient has lab appointment this Friday.

## 2015-01-20 ENCOUNTER — Other Ambulatory Visit: Payer: Self-pay

## 2015-01-20 ENCOUNTER — Telehealth: Payer: Self-pay | Admitting: Family Medicine

## 2015-01-20 ENCOUNTER — Other Ambulatory Visit: Payer: Self-pay | Admitting: Family Medicine

## 2015-01-20 DIAGNOSIS — R102 Pelvic and perineal pain: Secondary | ICD-10-CM

## 2015-01-20 LAB — CERVICOVAGINAL ANCILLARY ONLY
BACTERIAL VAGINITIS: NEGATIVE
CANDIDA VAGINITIS: NEGATIVE

## 2015-01-20 NOTE — Telephone Encounter (Signed)
Called and advised patient that order for US pelvis has been placed. She notes the discomfort in her pelvis is in the ovary area and that it is similar to prior sensations she has had prior to going on her periods. Notes the pressure is unchanged from her office visit. She denies nausea, vomiting, fever. Notes she feels well at this time. Advised given stability we will obtain the [place] next week. Advised that if her symptoms worsens, or she develops fever, abdominal pain, nausea, vomiting she should seek medical attention.

## 2015-01-23 ENCOUNTER — Encounter: Payer: Self-pay | Admitting: Surgical

## 2015-01-25 ENCOUNTER — Ambulatory Visit: Payer: Managed Care, Other (non HMO)

## 2015-01-27 ENCOUNTER — Other Ambulatory Visit (INDEPENDENT_AMBULATORY_CARE_PROVIDER_SITE_OTHER): Payer: Managed Care, Other (non HMO)

## 2015-01-27 DIAGNOSIS — R7401 Elevation of levels of liver transaminase levels: Secondary | ICD-10-CM

## 2015-01-27 DIAGNOSIS — R740 Nonspecific elevation of levels of transaminase and lactic acid dehydrogenase [LDH]: Secondary | ICD-10-CM

## 2015-01-27 LAB — COMPREHENSIVE METABOLIC PANEL
ALT: 35 U/L (ref 0–35)
AST: 18 U/L (ref 0–37)
Albumin: 3.8 g/dL (ref 3.5–5.2)
Alkaline Phosphatase: 53 U/L (ref 39–117)
BUN: 10 mg/dL (ref 6–23)
CHLORIDE: 104 meq/L (ref 96–112)
CO2: 26 meq/L (ref 19–32)
CREATININE: 0.59 mg/dL (ref 0.40–1.20)
Calcium: 8.7 mg/dL (ref 8.4–10.5)
GFR: 115.91 mL/min (ref >60.00)
GLUCOSE: 89 mg/dL (ref 70–99)
Potassium: 3.5 mEq/L (ref 3.5–5.1)
SODIUM: 139 meq/L (ref 135–145)
Total Bilirubin: 0.4 mg/dL (ref 0.2–1.2)
Total Protein: 7.1 g/dL (ref 6.0–8.3)

## 2015-02-02 ENCOUNTER — Ambulatory Visit: Admission: RE | Admit: 2015-02-02 | Payer: Managed Care, Other (non HMO) | Source: Ambulatory Visit

## 2015-02-13 ENCOUNTER — Ambulatory Visit: Payer: Self-pay | Admitting: Family Medicine

## 2015-02-13 ENCOUNTER — Telehealth: Payer: Self-pay | Admitting: Family Medicine

## 2015-02-13 NOTE — Telephone Encounter (Signed)
Patient is scheduled for 02/21/15
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
## 2015-02-13 NOTE — Telephone Encounter (Signed)
Noted. Patient can be scheduled for follow-up please. Thanks.

## 2015-02-20 ENCOUNTER — Encounter: Payer: Self-pay | Admitting: Family Medicine

## 2015-02-20 ENCOUNTER — Ambulatory Visit (INDEPENDENT_AMBULATORY_CARE_PROVIDER_SITE_OTHER): Payer: Managed Care, Other (non HMO) | Admitting: Family Medicine

## 2015-02-20 VITALS — BP 126/84 | HR 88 | Temp 98.6°F | Ht 63.35 in

## 2015-02-20 DIAGNOSIS — M1612 Unilateral primary osteoarthritis, left hip: Secondary | ICD-10-CM

## 2015-02-20 DIAGNOSIS — R030 Elevated blood-pressure reading, without diagnosis of hypertension: Secondary | ICD-10-CM | POA: Diagnosis not present

## 2015-02-20 DIAGNOSIS — N9489 Other specified conditions associated with female genital organs and menstrual cycle: Secondary | ICD-10-CM

## 2015-02-20 DIAGNOSIS — R102 Pelvic and perineal pain: Secondary | ICD-10-CM

## 2015-02-20 DIAGNOSIS — IMO0001 Reserved for inherently not codable concepts without codable children: Secondary | ICD-10-CM

## 2015-02-20 NOTE — Assessment & Plan Note (Signed)
Resolved. Seemed to be related to her periods. Patient reports OB felt this was ok if only associated with periods. Also felt abnormal periods related to menopause. No recurrence. Will continue to monitor.

## 2015-02-20 NOTE — Assessment & Plan Note (Signed)
In normal range today. Asymptomatic. Discussed patients family history. No premature CAD in family. ASCVD risk percentage does not indicate need for statin. Normal blood glucose makes DM unlikely. BP in normal range. Does not smoke. Discussed diet and exercise. Unable to exercise due to hip at this time. Advised on DASH diet and given handout on some dietary instructions. Patient will work on her diet and we will continue to monitor potential risk factors. Given return precautions.

## 2015-02-20 NOTE — Patient Instructions (Signed)
Nice to see you. Please look up the DASH diet and consider the diet below for heart healthy options for diet.   Diet Recommendations  Starchy (carb) foods: Bread, rice, pasta, potatoes, corn, cereal, grits, crackers, bagels, muffins, all baked goods.  (Fruits, milk, and yogurt also have carbohydrate, but most of these foods will not spike your blood sugar as the starchy foods will.)  A few fruits do cause high blood sugars; use small portions of bananas (limit to 1/2 at a time), grapes, watermelon, oranges, and most tropical fruits.    Protein foods: Meat, fish, poultry, eggs, dairy foods, and beans such as pinto and kidney beans (beans also provide carbohydrate).   1. Eat at least 3 meals and 1-2 snacks per day. Never go more than 4-5 hours while awake without eating. Eat breakfast within the first hour of getting up.   2. Limit starchy foods to TWO per meal and ONE per snack. ONE portion of a starchy  food is equal to the following:   - ONE slice of bread (or its equivalent, such as half of a hamburger bun).   - 1/2 cup of a "scoopable" starchy food such as potatoes or rice.   - 15 grams of carbohydrate as shown on food label.  3. Include at every meal: a protein food, a carb food, and vegetables and/or fruit.   - Obtain twice the volume of veg's as protein or carbohydrate foods for both lunch and dinner.   - Fresh or frozen veg's are best.   - Keep frozen veg's on hand for a quick vegetable serving.    If you develop worsening hip pain, numbness, weakness, abdominal pain, chest pain, or shortness of breath please seek medical attention.

## 2015-02-20 NOTE — Progress Notes (Signed)
Pre visit review using our clinic review tool, if applicable. No additional management support is needed unless otherwise documented below in the visit note. 

## 2015-02-20 NOTE — Assessment & Plan Note (Signed)
Is going to schedule surgery for hip replacement. Has been stable. Declines additional medication for pain management. Given return precautions.

## 2015-02-20 NOTE — Progress Notes (Signed)
Patient ID: Morgan Gonzalez, female   DOB: 23-Nov-1965, 49 y.o.   MRN: [id number]  Tommi Rumps, MD Phone: [phone]  Morgan Gonzalez is a 49 y.o. female who presents today for f/u.  Left hip OA: notes she is ready to schedule a surgery for hip replacement. Has had fluctuating hip pain. Had XR at ortho office last week that revealed bone on bone disease. Pain keeps her up at night. Sees chiropractor for this with good benefit. Motrin helps some. No weakness or numbness. Does not want additional pain medication.  Elevated BP: normal BP today. Not checking at home. No medications. Patient reports her mother and several relatives on her mothers side have stents, though they were in their 75's when they got them. No history of CAD in family prior to age 49. She has no history of diabetes. Last cholesterol was 205 total with LDL of 135. Her ascvd risk score is 1.4% placing her in the no statin benefit group. BP in normal range today. Has difficulty exercising due to hip OA. Denies chest pain, shortness of breath, and edema.   Patient notes she saw her OB in follow-up and had normal pap and HPV testing. Had vit d checked and mmamogram, though has not recevied the results of these. States OB advised that she was likely at the start of menopause and this was the cause of her fluctuating periods and pelvic pressure. She denies recent symptoms. No abdominal pain or pelvic pain. LMP 02/07/15.   PMH: nonsmoker.    ROS see HPI  Objective  Physical Exam Filed Vitals:   02/20/15 0931  BP: 126/84  Pulse: 88  Temp: 98.6 F (37 C)    Physical Exam  Constitutional: She is well-developed, well-nourished, and in no distress.  HENT:  Head: Normocephalic and atraumatic.  Cardiovascular: Normal rate, regular rhythm and normal heart sounds.  Exam reveals no gallop and no friction rub.   No murmur heard. Pulmonary/Chest: Breath sounds normal. No respiratory distress. She has no wheezes. She has no rales.    Musculoskeletal:  Mild tenderness over lateral aspect of left hip, decreased internal ROM with discomfort, full external ROM, normal left hip ROM with no pain, no tenderness, feet WWP  Neurological: She is alert.  5/5 strength in bilateral quads, hamstrings, plantar and dorsiflexion, sensation to light touch intact in bilateral LE, normal gait, 2+ patellar reflexes  Skin: Skin is warm and dry. She is not diaphoretic.     Assessment/Plan: Please see individual problem list.  Pelvic pressure in female Resolved. Seemed to be related to her periods. Patient reports OB felt this was ok if only associated with periods. Also felt abnormal periods related to menopause. No recurrence. Will continue to monitor.   Osteoarthritis of left hip Is going to schedule surgery for hip replacement. Has been stable. Declines additional medication for pain management. Given return precautions.   Elevated blood pressure In normal range today. Asymptomatic. Discussed patients family history. No premature CAD in family. ASCVD risk percentage does not indicate need for statin. Normal blood glucose makes DM unlikely. BP in normal range. Does not smoke. Discussed diet and exercise. Unable to exercise due to hip at this time. Advised on DASH diet and given handout on some dietary instructions. Patient will work on her diet and we will continue to monitor potential risk factors. Given return precautions.     Tommi Rumps

## 2015-02-21 ENCOUNTER — Ambulatory Visit: Payer: Self-pay | Admitting: Family Medicine

## 2015-05-24 ENCOUNTER — Ambulatory Visit (INDEPENDENT_AMBULATORY_CARE_PROVIDER_SITE_OTHER): Payer: Managed Care, Other (non HMO) | Admitting: Family Medicine

## 2015-05-24 ENCOUNTER — Encounter (INDEPENDENT_AMBULATORY_CARE_PROVIDER_SITE_OTHER): Payer: Self-pay

## 2015-05-24 ENCOUNTER — Encounter: Payer: Self-pay | Admitting: Family Medicine

## 2015-05-24 VITALS — BP 134/88 | HR 94 | Temp 98.8°F | Ht 63.35 in | Wt 172.6 lb

## 2015-05-24 DIAGNOSIS — E669 Obesity, unspecified: Secondary | ICD-10-CM | POA: Diagnosis not present

## 2015-05-24 DIAGNOSIS — J0100 Acute maxillary sinusitis, unspecified: Secondary | ICD-10-CM | POA: Insufficient documentation

## 2015-05-24 DIAGNOSIS — IMO0001 Reserved for inherently not codable concepts without codable children: Secondary | ICD-10-CM

## 2015-05-24 DIAGNOSIS — R030 Elevated blood-pressure reading, without diagnosis of hypertension: Secondary | ICD-10-CM

## 2015-05-24 MED ORDER — AMOXICILLIN-POT CLAVULANATE 875-125 MG PO TABS: 1.0000 | ORAL_TABLET | Freq: Two times a day (BID) | ORAL | Status: DC

## 2015-05-24 NOTE — Progress Notes (Signed)
Pre visit review using our clinic review tool, if applicable. No additional management support is needed unless otherwise documented below in the visit note. 

## 2015-05-24 NOTE — Assessment & Plan Note (Signed)
Elevated and on one check today. She is asymptomatic. Discussed diet and exercise. She will check her blood pressure at home for the next week. She'll follow-up in one week with the nurse for blood pressure check. Follow up with me in one month.

## 2015-05-24 NOTE — Progress Notes (Signed)
Patient ID: Morgan Gonzalez, female   DOB: [birth year], 49 y.o.   MRN: [id number]  Tommi Rumps, MD Phone: [phone]  Morgan Gonzalez is a 49 y.o. female who presents today for follow-up.  Sinus infection: Patient notes last week she had a cold. Noted this started with postnasal drip and blowing small amount of mucus out of her nose. She notes her symptoms improved some for a couple of days, though over the last 3-4 days she has developed maxillary sinus pressure. She notes it feels full in this area. She does note mild bitemporal tension headache with this. She notes no fevers, numbness, or weakness. She does note some ear fullness and postnasal drip. She does note in the morning when she wakes up and for about 2 minutes her vision is a little cloudy until she blinks her eyes. She notes she has a history of this when she has had sinus infections in the past. She doesn't have blurry vision at other times during the day. No eye pain. Is not taking any medication for this.  Elevated blood pressure: Patient had this on her initial visit with me. She has not been checking her blood pressure at home. She does have a history of preeclampsia. She does not have chest pain, shortness of breath, or edema. She is not currently on any medications.  Obesity: Patient's weight is stable from previously. She's not been able to exercise due to left hip OA. She notes she has surgery scheduled on January 23 for hip replacement. She has been starting to watch her diet in the last week or so. She's been eating hard-boiled egg for breakfast. Tomato juice for a morning snack. And then salads typically with tuna or beans for lunch. She has difficulty exercising due to the pain in her left hip.  PMH: nonsmoker.   ROS see history of present illness  Objective  Physical Exam Filed Vitals:   05/24/15 0925 05/24/15 0931  BP: 142/98 134/88  Pulse:    Temp:      BP Readings from Last 3 Encounters:  05/24/15 134/88    02/20/15 126/84  01/13/15 124/82   Wt Readings from Last 3 Encounters:  05/24/15 172 lb 9.6 oz (78.291 kg)  01/13/15 171 lb 12.8 oz (77.928 kg)    Physical Exam  Constitutional: She is well-developed, well-nourished, and in no distress.  HENT:  Head: Normocephalic and atraumatic.  Right Ear: External ear normal.  Left Ear: External ear normal.  Normal TMs bilaterally, mild posterior oropharyngeal erythema with no exudate or tonsillar swelling  Neck: Neck supple.  Cardiovascular: Normal rate, regular rhythm and normal heart sounds.  Exam reveals no gallop and no friction rub.   No murmur heard. Pulmonary/Chest: Effort normal and breath sounds normal. No respiratory distress. She has no wheezes. She has no rales.  Musculoskeletal:  Right hip with normal range of motion with no pain, left hip decreased internal or external rotation with no pain  Lymphadenopathy:    She has no cervical adenopathy.  Neurological: She is alert.  CN 2-12 intact, 5/5 strength in bilateral biceps, triceps, grip, quads, hamstrings, plantar and dorsiflexion, sensation to light touch intact in bilateral UE and LE, normal gait, 2+ patellar reflexes  Skin: Skin is warm and dry. She is not diaphoretic.     Assessment/Plan: Please see individual problem list.  Elevated blood pressure Elevated and on one check today. She is asymptomatic. Discussed diet and exercise. She will check her blood pressure at home for the  next week. She'll follow-up in one week with the nurse for blood pressure check. Follow up with me in one month.  Obesity BMI slightly over 30. Discussed diet and exercise. It's difficult for her to exercise at this time given her left hip OA. She is scheduled for surgery for this in January. Follow-up in one month.  Sinusitis, acute maxillary Symptoms are most consistent with bacterial sinusitis given improvement and then worsening and sinus pressure. She is neurologically intact today and vision  is normal. Suspect that she may have eye discharge or film build up when she wakes up in the morning and that is why her vision is mildly blurry in the morning. She does note a history of this in the past with her sinus infections. We'll treat her with Augmentin. She is advised on probiotic. She can start on Nasonex again as well. She is given return precautions.    Meds ordered this encounter  Medications  . amoxicillin-clavulanate (AUGMENTIN) 875-125 MG tablet    Sig: Take 1 tablet by mouth 2 (two) times daily.    Dispense:  14 tablet    Refill:  0    # Healthcare maintenance: Declined flu shot  Dragon voice recognition software was used during the dictation process of this note. If any phrases or words seem inappropriate it is likely secondary to the translation process being inefficient.  Tommi Rumps

## 2015-05-24 NOTE — Assessment & Plan Note (Addendum)
BMI slightly over 30. Discussed diet and exercise. It's difficult for her to exercise at this time given her left hip OA. She is scheduled for surgery for this in January. Follow-up in one month.

## 2015-05-24 NOTE — Assessment & Plan Note (Signed)
Symptoms are most consistent with bacterial sinusitis given improvement and then worsening and sinus pressure. She is neurologically intact today and vision is normal. Suspect that she may have eye discharge or film build up when she wakes up in the morning and that is why her vision is mildly blurry in the morning. She does note a history of this in the past with her sinus infections. We'll treat her with Augmentin. She is advised on probiotic. She can start on Nasonex again as well. She is given return precautions.

## 2015-05-24 NOTE — Patient Instructions (Signed)
Nice to see you. You likely have a sinus infection we will treat this with Augmentin. She should take a probiotic while taking this. It will be important that you work on diet and exercise for your blood pressure and for your weight. Please look at the instructions below and incorporate some of these to help lose weight. If you develop persistent blurry vision, or numbness, weakness, worsening headaches, fever, dizziness, or any new or change in symptoms please seek medical attention.

## 2015-05-31 ENCOUNTER — Ambulatory Visit (INDEPENDENT_AMBULATORY_CARE_PROVIDER_SITE_OTHER): Payer: Managed Care, Other (non HMO)

## 2015-05-31 VITALS — BP 136/74 | HR 90 | Resp 18

## 2015-05-31 DIAGNOSIS — IMO0001 Reserved for inherently not codable concepts without codable children: Secondary | ICD-10-CM

## 2015-05-31 DIAGNOSIS — R030 Elevated blood-pressure reading, without diagnosis of hypertension: Secondary | ICD-10-CM | POA: Diagnosis not present

## 2015-05-31 NOTE — Progress Notes (Signed)
Patient came in for BP check.  Patient was very stress coming in the office.  She has three children at home and has a upcoming surgery so she is alittle overwhelmed.  I had her sit and vent first and then checked her BP after her relaxing for a few minutes.  See vitals for details.    She wanted you to know that she is a avid coffee drinker, has had two tall cups this morning.   Please advise?

## 2015-05-31 NOTE — Progress Notes (Signed)
Blood pressure is in normal range. Will continue to monitor.

## 2015-06-14 ENCOUNTER — Ambulatory Visit: Payer: Self-pay | Admitting: Family Medicine

## 2015-06-15 ENCOUNTER — Ambulatory Visit: Payer: Self-pay | Admitting: Family Medicine

## 2015-06-16 ENCOUNTER — Encounter: Payer: Self-pay | Admitting: Family Medicine

## 2015-06-16 ENCOUNTER — Ambulatory Visit (INDEPENDENT_AMBULATORY_CARE_PROVIDER_SITE_OTHER): Payer: Managed Care, Other (non HMO) | Admitting: Family Medicine

## 2015-06-16 VITALS — BP 130/84 | HR 95 | Temp 98.9°F | Wt 173.0 lb

## 2015-06-16 DIAGNOSIS — J029 Acute pharyngitis, unspecified: Secondary | ICD-10-CM

## 2015-06-16 DIAGNOSIS — E669 Obesity, unspecified: Secondary | ICD-10-CM

## 2015-06-16 DIAGNOSIS — M1612 Unilateral primary osteoarthritis, left hip: Secondary | ICD-10-CM

## 2015-06-16 HISTORY — DX: Acute pharyngitis, unspecified: J02.9

## 2015-06-16 NOTE — Assessment & Plan Note (Signed)
Scheduled for surgery on Monday. Physical therapy afterwards.

## 2015-06-16 NOTE — Assessment & Plan Note (Signed)
Minimal sore throat at this time. No other symptoms. Benign exam. Could be allergies versus viral illness versus other cause. She'll continue her nasal spray. She'll continue to monitor. I did advise that if this does progress she needs to inform her surgeon and the anesthesiologist prior to the surgery on Monday. She voiced understanding. She is given return precautions.

## 2015-06-16 NOTE — Progress Notes (Signed)
Patient ID: Morgan Gonzalez, female   DOB: 10-22-1965, 50 y.o.   MRN: [id number]  Tommi Rumps, MD Phone: [phone]  Morgan Gonzalez is a 50 y.o. female who presents today for follow-up.  Obesity: Patient notes last week for preoperative visit she weighed 167 pounds. She notes typically at home she is in the 169-170 pound range. At Christmas she was 173 pounds at home. She's changed her diet to include more salads. She's not finishing her children's food anymore. She has 3 meals a day with protein at each meal. That she eats vegetables for lunch and dinner. She is walking every other day and taking the stairs more often. Her exercise capacity is limited by left hip OA.  Left hip osteoarthritis: Patient notes she is scheduled for surgery on Monday. She notes she has discomfort in the hip today. This is a chronic issue. She states she was advised that she could not take any medicine for it until the surgery. Following the surgery she'll have 2 weeks of physical therapy at home and then possibly some outpatient physical therapy. She is looking forward to the surgery.  Sore throat: Patient notes about one day of minimal sore throat. She notes this can occur when she is particularly stressed out. She notes no postnasal drip, congestion, cough, fever, or ear pain. No sick contacts.  PMH: nonsmoker.   ROS see history of present illness  Objective  Physical Exam Filed Vitals:   06/16/15 1058  BP: 130/84  Pulse: 95  Temp: 98.9 F (37.2 C)    Physical Exam  Constitutional: She is well-developed, well-nourished, and in no distress.  HENT:  Head: Normocephalic and atraumatic.  Right Ear: External ear normal.  Left Ear: External ear normal.  Mouth/Throat: Oropharynx is clear and moist. No oropharyngeal exudate.  Normal TMs bilaterally  Eyes: Conjunctivae are normal. Pupils are equal, round, and reactive to light.  Neck: Neck supple.  Cardiovascular: Normal rate, regular rhythm and normal heart  sounds.  Exam reveals no gallop and no friction rub.   No murmur heard. Pulmonary/Chest: Effort normal and breath sounds normal. No respiratory distress. She has no wheezes. She has no rales.  Musculoskeletal:  Left hip with decreased internal and external range of motion with some discomfort at patient notes is at her baseline, right hip with full range of motion and no discomfort  Lymphadenopathy:    She has no cervical adenopathy.  Neurological: She is alert. Gait normal.  Skin: Skin is warm and dry. She is not diaphoretic.     Assessment/Plan: Please see individual problem list.  Obesity Weight appears to be up here from last visit, though she reports her weight at home on her scale is down. She has made some progress with her diet. I gave her diet information and we discussed limiting carbs but not fully cutting them out. She will continue her current exercise regimen until her surgery. She will then do physical therapy and gradually increase exercise per her orthopedist.  Osteoarthritis of left hip Scheduled for surgery on Monday. Physical therapy afterwards.  Sore throat Minimal sore throat at this time. No other symptoms. Benign exam. Could be allergies versus viral illness versus other cause. She'll continue her nasal spray. She'll continue to monitor. I did advise that if this does progress she needs to inform her surgeon and the anesthesiologist prior to the surgery on Monday. She voiced understanding. She is given return precautions.     Tommi Rumps

## 2015-06-16 NOTE — Patient Instructions (Addendum)
Nice to see you. Please continue to work on your diet as you have been. There are diet instructions below.  You should let the surgeon and anesthesiologist about your sore throat and if you develop any further symptoms.  If you develop fever, cough, congestion, or any new or change in symptoms please seek medical attention.   Diet Recommendations  Starchy (carb) foods: Bread, rice, pasta, potatoes, corn, cereal, grits, crackers, bagels, muffins, all baked goods.  (Fruits, milk, and yogurt also have carbohydrate, but most of these foods will not spike your blood sugar as the starchy foods will.)  A few fruits do cause high blood sugars; use small portions of bananas (limit to 1/2 at a time), grapes, watermelon, oranges, and most tropical fruits.    Protein foods: Meat, fish, poultry, eggs, dairy foods, and beans such as pinto and kidney beans (beans also provide carbohydrate).   1. Eat at least 3 meals and 1-2 snacks per day. Never go more than 4-5 hours while awake without eating. Eat breakfast within the first hour of getting up.   2. Limit starchy foods to TWO per meal and ONE per snack. ONE portion of a starchy  food is equal to the following:   - ONE slice of bread (or its equivalent, such as half of a hamburger bun).   - 1/2 cup of a "scoopable" starchy food such as potatoes or rice.   - 15 grams of carbohydrate as shown on food label.  3. Include at every meal: a protein food, a carb food, and vegetables and/or fruit.   - Obtain twice the volume of veg's as protein or carbohydrate foods for both lunch and dinner.   - Fresh or frozen veg's are best.   - Keep frozen veg's on hand for a quick vegetable serving.

## 2015-06-16 NOTE — Progress Notes (Signed)
Pre visit review using our clinic review tool, if applicable. No additional management support is needed unless otherwise documented below in the visit note. 

## 2015-06-16 NOTE — Assessment & Plan Note (Signed)
Weight appears to be up here from last visit, though she reports her weight at home on her scale is down. She has made some progress with her diet. I gave her diet information and we discussed limiting carbs but not fully cutting them out. She will continue her current exercise regimen until her surgery. She will then do physical therapy and gradually increase exercise per her orthopedist.

## 2015-09-12 ENCOUNTER — Ambulatory Visit (INDEPENDENT_AMBULATORY_CARE_PROVIDER_SITE_OTHER): Payer: Managed Care, Other (non HMO) | Admitting: Family Medicine

## 2015-09-12 ENCOUNTER — Encounter: Payer: Self-pay | Admitting: Family Medicine

## 2015-09-12 VITALS — BP 122/76 | HR 78 | Temp 99.2°F | Wt 170.4 lb

## 2015-09-12 DIAGNOSIS — T148 Other injury of unspecified body region: Secondary | ICD-10-CM | POA: Diagnosis not present

## 2015-09-12 DIAGNOSIS — T148XXA Other injury of unspecified body region, initial encounter: Secondary | ICD-10-CM

## 2015-09-12 MED ORDER — DOXYCYCLINE HYCLATE 100 MG PO TABS: 100.0000 mg | ORAL_TABLET | Freq: Two times a day (BID) | ORAL | Status: DC

## 2015-09-12 NOTE — Assessment & Plan Note (Signed)
I discussed the nature of the injury with the patient. Discussed that there did not appear to be an infection at this time. Discussed potential for infection at the site of her newly replaced hip though this would seem less likely given no signs of infection at the site of the bruise. I discussed the risks and benefits of observing versus treating prophylactically. We opted for treatment prophylactically with doxycycline to cover skin flora and potential for vibrio given possible crustacean involvement. She will continue to monitor this while on antibiotics. She is given return precautions.

## 2015-09-12 NOTE — Progress Notes (Signed)
Patient ID: Morgan Gonzalez, female   DOB: Jun 06, 1965, 50 y.o.   MRN: [id number]  Tommi Rumps, MD Phone: [phone]  Morgan Gonzalez is a 50 y.o. female who presents today for same-day visit.  Patient notes she was in [place] last week fishing with her family when her son hooked her left arm near her elbow with a fishhook. Notes it only went in 3-4 mm. Her husband took the fishhook out. The next day there was a bruise in this area. She is afraid that given her recent history of hip replacement that she could get infection from the fish hook that could go to her hips. She's not had any fevers. She's not had any redness at the site. Notes it is sore. There has not been any bleeding. No warmth to the area. No drainage from the area. She does note there was shrimp and some kind of crustaceans or shellfish on the fish hook. Happened 5 days ago.  PMH: nonsmoker   ROS see history of present illness  Objective  Physical Exam Filed Vitals:   09/12/15 1120  BP: 122/76  Pulse: 78  Temp: 99.2 F (37.3 C)    BP Readings from Last 3 Encounters:  09/12/15 122/76  06/16/15 130/84  05/31/15 136/74   Wt Readings from Last 3 Encounters:  09/12/15 170 lb 6.4 oz (77.293 kg)  06/16/15 173 lb (78.472 kg)  05/24/15 172 lb 9.6 oz (78.291 kg)    Physical Exam  Constitutional: She is well-developed, well-nourished, and in no distress.  Cardiovascular: Normal rate, regular rhythm and normal heart sounds.   Pulmonary/Chest: Effort normal and breath sounds normal.  Musculoskeletal: She exhibits no edema.  No swelling of left arm, no tenderness of left arm  Neurological: She is alert. Gait normal.  Skin: Skin is warm and dry.        Assessment/Plan: Please see individual problem list.  Bruise of left arm I discussed the nature of the injury with the patient. Discussed that there did not appear to be an infection at this time. Discussed potential for infection at the site of her newly replaced hip  though this would seem less likely given no signs of infection at the site of the bruise. I discussed the risks and benefits of observing versus treating prophylactically. We opted for treatment prophylactically with doxycycline to cover skin flora and potential for vibrio given possible crustacean involvement. She will continue to monitor this while on antibiotics. She is given return precautions.    Meds ordered this encounter  Medications  . doxycycline (VIBRA-TABS) 100 MG tablet    Sig: Take 1 tablet (100 mg total) by mouth 2 (two) times daily.    Dispense:  14 tablet    Refill:  0    Tommi Rumps, MD [place]

## 2015-09-12 NOTE — Patient Instructions (Signed)
Nice to see you. The lesion on your arm is likely a bruise, though given your history of joint replacements we will cover prophylactically with an antibiotic doxycycline area. If you develop joint pain, fever, redness, warmth of your arm, pain in your arm, or any new or changing symptoms please seek medical attention.

## 2015-09-12 NOTE — Progress Notes (Signed)
Pre visit review using our clinic review tool, if applicable. No additional management support is needed unless otherwise documented below in the visit note. 

## 2015-09-15 ENCOUNTER — Encounter: Payer: Self-pay | Admitting: Family Medicine

## 2015-09-15 ENCOUNTER — Ambulatory Visit (INDEPENDENT_AMBULATORY_CARE_PROVIDER_SITE_OTHER): Payer: Managed Care, Other (non HMO) | Admitting: Family Medicine

## 2015-09-15 VITALS — BP 136/88 | HR 91 | Temp 98.5°F | Ht 63.35 in | Wt 170.0 lb

## 2015-09-15 DIAGNOSIS — R112 Nausea with vomiting, unspecified: Secondary | ICD-10-CM

## 2015-09-15 DIAGNOSIS — B349 Viral infection, unspecified: Secondary | ICD-10-CM | POA: Insufficient documentation

## 2015-09-15 MED ORDER — OSELTAMIVIR PHOSPHATE 75 MG PO CAPS: 75.0000 mg | ORAL_CAPSULE | Freq: Every day | ORAL | Status: DC

## 2015-09-15 NOTE — Progress Notes (Signed)
Pre visit review using our clinic review tool, if applicable. No additional management support is needed unless otherwise documented below in the visit note. 

## 2015-09-15 NOTE — Patient Instructions (Signed)
Nice to see you. Her symptoms are likely related to viral illness and this could be the flu. We will treat you with prophylactic Tamiflu 1 tablet daily for 10 days. Please take her doxycycline with food. If you continue to have symptoms despite taking the doxycycline. Would stop this medication and let us know. If you develop fevers, abdominal pain, vomiting up blood, inability to tolerate oral intake, or any new or changing symptoms please seek medical attention.

## 2015-09-15 NOTE — Assessment & Plan Note (Signed)
Symptoms most likely related to viral illness possibly viral gastroenteritis. Less likely this could be related to taking doxycycline on an empty stomach. She has been exposed to the flu. She had a negative flu test in the office. We will treat her with prophylactic Tamiflu given symptoms do not seem consistent with influenza and thus do not warrant treatment dose Tamiflu with a negative flu test. She will take the doxycycline on a full stomach with her next dose and if symptoms recur with this she will stop the doxycycline. She will stay well hydrated. She'll continue to monitor. She is given return precautions.

## 2015-09-15 NOTE — Progress Notes (Signed)
Patient ID: Morgan Gonzalez, female   DOB: 11/29/1965, 50 y.o.   MRN: [id number]  Tommi Rumps, MD Phone: [phone]  Morgan Gonzalez is a 50 y.o. female who presents today for same-day visit.  Patient notes onset of nausea, vomiting, and a cold sweat this morning while she was with her son at the pediatrician's office. Her son had similar symptoms and was diagnosed with influenza type a. Patient notes she has not vomited since then. There is no blood in her vomit. She feels tired overall. No fever. Some mild frontal and maxillary sinus headache with no numbness or weakness. No ear pain. No rhinorrhea. No cough. No postnasal drip. She did not get a flu shot. She does note she took her doxycycline on an empty stomach today and thinks this may have contributed some.  PMH: nonsmoker.   ROS see history of present illness  Objective  Physical Exam Filed Vitals:   09/15/15 1331  BP: 136/88  Pulse: 91  Temp: 98.5 F (36.9 C)    BP Readings from Last 3 Encounters:  09/15/15 136/88  09/12/15 122/76  06/16/15 130/84   Wt Readings from Last 3 Encounters:  09/15/15 170 lb (77.111 kg)  09/12/15 170 lb 6.4 oz (77.293 kg)  06/16/15 173 lb (78.472 kg)    Physical Exam  Constitutional: She is well-developed, well-nourished, and in no distress.  HENT:  Head: Normocephalic and atraumatic.  Right Ear: External ear normal.  Left Ear: External ear normal.  Mouth/Throat: No oropharyngeal exudate.  Normal TMs bilaterally  Eyes: Conjunctivae are normal. Pupils are equal, round, and reactive to light.  Cardiovascular: Normal rate, regular rhythm and normal heart sounds.   Pulmonary/Chest: Effort normal and breath sounds normal.  Abdominal: Soft. Bowel sounds are normal. She exhibits no distension. There is no tenderness. There is no rebound and no guarding.  Neurological: She is alert. Gait normal.  Skin: Skin is warm and dry. She is not diaphoretic.     Assessment/Plan: Please see individual  problem list.  Viral illness Symptoms most likely related to viral illness possibly viral gastroenteritis. Less likely this could be related to taking doxycycline on an empty stomach. She has been exposed to the flu. She had a negative flu test in the office. We will treat her with prophylactic Tamiflu given symptoms do not seem consistent with influenza and thus do not warrant treatment dose Tamiflu with a negative flu test. She will take the doxycycline on a full stomach with her next dose and if symptoms recur with this she will stop the doxycycline. She will stay well hydrated. She'll continue to monitor. She is given return precautions.    Orders Placed This Encounter  Procedures  . POCT Influenza A/B    Meds ordered this encounter  Medications  . oseltamivir (TAMIFLU) 75 MG capsule    Sig: Take 1 capsule (75 mg total) by mouth daily.    Dispense:  10 capsule    Refill:  0   Tommi Rumps, MD [place]

## 2017-11-03 DIAGNOSIS — J0140 Acute pansinusitis, unspecified: Secondary | ICD-10-CM | POA: Diagnosis not present

## 2017-11-18 DIAGNOSIS — J019 Acute sinusitis, unspecified: Secondary | ICD-10-CM | POA: Diagnosis not present

## 2017-11-18 DIAGNOSIS — J309 Allergic rhinitis, unspecified: Secondary | ICD-10-CM | POA: Diagnosis not present

## 2017-12-08 DIAGNOSIS — D1801 Hemangioma of skin and subcutaneous tissue: Secondary | ICD-10-CM | POA: Diagnosis not present

## 2017-12-08 DIAGNOSIS — D225 Melanocytic nevi of trunk: Secondary | ICD-10-CM | POA: Diagnosis not present

## 2017-12-08 DIAGNOSIS — Z1283 Encounter for screening for malignant neoplasm of skin: Secondary | ICD-10-CM | POA: Diagnosis not present

## 2017-12-08 DIAGNOSIS — D485 Neoplasm of uncertain behavior of skin: Secondary | ICD-10-CM | POA: Diagnosis not present

## 2018-04-09 DIAGNOSIS — R350 Frequency of micturition: Secondary | ICD-10-CM | POA: Diagnosis not present

## 2018-06-12 DIAGNOSIS — I10 Essential (primary) hypertension: Secondary | ICD-10-CM | POA: Diagnosis not present

## 2018-06-12 DIAGNOSIS — K120 Recurrent oral aphthae: Secondary | ICD-10-CM | POA: Diagnosis not present

## 2018-06-12 DIAGNOSIS — J0141 Acute recurrent pansinusitis: Secondary | ICD-10-CM | POA: Diagnosis not present

## 2018-08-07 DIAGNOSIS — N390 Urinary tract infection, site not specified: Secondary | ICD-10-CM | POA: Diagnosis not present

## 2018-08-20 ENCOUNTER — Telehealth: Payer: Self-pay | Admitting: Urology

## 2018-08-20 NOTE — Telephone Encounter (Signed)
I called pt today to resch her appt 2-3 months out.  She doesn't have a pcp and said she goes to urgent care to treat frequent UTI's, who referred her to [place].  They told her at an office visit (not the visit that referral was sent, but one prior to that) there was no bacteria, but blood in urine and white blood cells were high, so pt is concerned with pushing appt out that far.  Please advise.

## 2018-08-20 NOTE — Telephone Encounter (Signed)
Please set up virtual visit in 1 to 2 weeks.  Nickolas Madrid, MD 08/20/2018

## 2018-08-20 NOTE — Telephone Encounter (Signed)
ERROR

## 2018-08-21 NOTE — Telephone Encounter (Addendum)
Virtual Office visit made-patient aware

## 2018-08-21 NOTE — Telephone Encounter (Signed)
Can cancel May visit as we set up a virtual visit sooner, thanks  Nickolas Madrid, MD 08/21/2018

## 2018-08-31 ENCOUNTER — Telehealth: Payer: Self-pay | Admitting: Urology

## 2018-08-31 ENCOUNTER — Telehealth (INDEPENDENT_AMBULATORY_CARE_PROVIDER_SITE_OTHER): Payer: Managed Care, Other (non HMO) | Admitting: Urology

## 2018-08-31 ENCOUNTER — Other Ambulatory Visit: Payer: Self-pay

## 2018-08-31 ENCOUNTER — Ambulatory Visit: Payer: Self-pay | Admitting: Urology

## 2018-08-31 DIAGNOSIS — N390 Urinary tract infection, site not specified: Secondary | ICD-10-CM

## 2018-08-31 NOTE — Telephone Encounter (Signed)
-----   Message from Billey Co, MD sent at 08/31/2018 10:01 AM EDT ----- Regarding: follow up Please schedule follow up in one year with urinalysis and pvr  Nickolas Madrid, MD 08/31/2018

## 2018-08-31 NOTE — Telephone Encounter (Signed)
App scheduled and patient is aware  Sharyn Lull

## 2018-08-31 NOTE — Telephone Encounter (Signed)
Sample left up front  [facility]

## 2018-08-31 NOTE — Telephone Encounter (Signed)
-----   Message from Billey Co, MD sent at 08/31/2018 10:00 AM EDT ----- Regarding: SAMPLES This patient will come by the office this week to pick up a sample of premarin cream, thanks  Nickolas Madrid, MD 08/31/2018

## 2018-08-31 NOTE — Progress Notes (Addendum)
Virtual Visit via Video Note  I connected with Morgan Gonzalez on 08/31/18 at  9:45 AM EDT by secure video chat and verified that I am speaking with the correct person using two identifiers.   I discussed the limitations, risks, security and privacy concerns of performing an evaluation and management service by virtual visit and the availability of in person appointments. We discussed the impact of the COVID-19 on the healthcare system, and the importance of social distancing and reducing patient and provider exposure. I also discussed with the patient that there may be a patient responsible charge related to this service. The patient expressed understanding and agreed to proceed.  Reason for visit: Recurrent UTIs  History of Present Illness: Morgan Gonzalez is a 53 year old female with past medical history notable for hypertension and multiple hip replacements who I am evaluating today for recurrent UTIs.  She reports she is perimenopausal.  She reports over the last year she has had 4 UTIs.  She reports only 2 of these have been culture proven.  Her outside records are unavailable to me, as she is typically seen in urgent care outside of our current system.  When she has a UTI, she has symptoms of pelvic pressure and dysuria.  She denies any flank pain or gross hematuria.  Her symptoms resolved with antibiotics.  She denies a history of nephrolithiasis.  She has minimal stress urinary incontinence with forceful sneezing.  She denies any prior episodes of pyelonephritis.  There are no aggravating or alleviating factors.  Severity is moderate.  Assessment and Plan: We discussed the evaluation and treatment of patients with recurrent UTIs at length.  We specifically discussed the differences between asymptomatic bacteriuria and true urinary tract infection.  We discussed the AUA definition of recurrent UTI of at least 2 culture proven symptomatic acute cystitis episodes in a 68-month period, or 3 within a 1 year  period.  We discussed the importance of culture directed antibiotic treatment, and antibiotic stewardship.  First-line therapy includes nitrofurantoin(5 days), Bactrim(3 days), or fosfomycin(3 g single dose).  Possible etiologies of recurrent infection include periurethral tissue atrophy in postmenopausal woman, constipation, sexual activity, incomplete emptying, anatomic abnormalities, and even genetic predisposition.  Finally, we discussed the role of perineal hygiene, timed voiding, adequate hydration, topical vaginal estrogen, cranberry prophylaxis, and low-dose antibiotic prophylaxis.  Follow Up: -Trial of premarin cream and cranberry ppx -RTC one year for symptom check, sooner if persistent problems -Consider IC if ongoing symptoms with negative urine culture   I discussed the assessment and treatment plan with the patient. The patient was provided an opportunity to ask questions and all were answered. The patient agreed with the plan and demonstrated an understanding of the instructions.   The patient was advised to call back or seek an in-person evaluation if the symptoms worsen or if the condition fails to improve as anticipated.  I provided 20 minutes of non-face-to-face time during this encounter.   Billey Co, MD

## 2018-10-22 ENCOUNTER — Ambulatory Visit: Payer: Self-pay | Admitting: Urology

## 2018-12-03 ENCOUNTER — Telehealth: Payer: Self-pay

## 2018-12-03 NOTE — Telephone Encounter (Signed)
Ok if PCP ok

## 2018-12-03 NOTE — Telephone Encounter (Signed)
Copied from [place] [id number]. Topic: Appointment Scheduling - Transfer of Care >> Dec 03, 2018  2:46 PM Rayann Heman wrote: Pt is requesting to transfer FROM: [place]  Pt is requesting to transfer TO: [place]  Reason for requested transfer: would like a female   Send CRM to patient's current PCP (transferring FROM).

## 2018-12-03 NOTE — Telephone Encounter (Signed)
That is fine with me. I have not seen her in >3 years.

## 2018-12-03 NOTE — Telephone Encounter (Signed)
Ok to schedule   [facility]

## 2018-12-08 ENCOUNTER — Encounter: Payer: Self-pay | Admitting: Internal Medicine

## 2018-12-10 ENCOUNTER — Encounter: Payer: Self-pay | Admitting: Internal Medicine

## 2018-12-10 ENCOUNTER — Other Ambulatory Visit: Payer: Self-pay

## 2018-12-10 ENCOUNTER — Ambulatory Visit (INDEPENDENT_AMBULATORY_CARE_PROVIDER_SITE_OTHER): Payer: BC Managed Care – PPO | Admitting: Internal Medicine

## 2018-12-10 VITALS — Ht 63.0 in | Wt 182.0 lb

## 2018-12-10 DIAGNOSIS — Z1329 Encounter for screening for other suspected endocrine disorder: Secondary | ICD-10-CM | POA: Diagnosis not present

## 2018-12-10 DIAGNOSIS — J309 Allergic rhinitis, unspecified: Secondary | ICD-10-CM | POA: Insufficient documentation

## 2018-12-10 DIAGNOSIS — I10 Essential (primary) hypertension: Secondary | ICD-10-CM | POA: Diagnosis not present

## 2018-12-10 DIAGNOSIS — Z1389 Encounter for screening for other disorder: Secondary | ICD-10-CM

## 2018-12-10 DIAGNOSIS — E559 Vitamin D deficiency, unspecified: Secondary | ICD-10-CM | POA: Diagnosis not present

## 2018-12-10 DIAGNOSIS — R739 Hyperglycemia, unspecified: Secondary | ICD-10-CM

## 2018-12-10 MED ORDER — FLUTICASONE PROPIONATE 50 MCG/ACT NA SUSP: 2.0000 | Freq: Every day | NASAL | 12 refills | Status: DC

## 2018-12-10 MED ORDER — AMLODIPINE BESYLATE 2.5 MG PO TABS: 2.5000 mg | ORAL_TABLET | Freq: Every day | ORAL | 3 refills | Status: DC

## 2018-12-10 MED ORDER — MONTELUKAST SODIUM 10 MG PO TABS: 10.0000 mg | ORAL_TABLET | Freq: Every day | ORAL | 3 refills | Status: DC

## 2018-12-10 MED ORDER — LORATADINE 10 MG PO TABS: 10.0000 mg | ORAL_TABLET | Freq: Every day | ORAL | 3 refills | Status: DC | PRN

## 2018-12-10 MED ORDER — SALINE SPRAY 0.65 % NA SOLN: 1.0000 | NASAL | 12 refills | Status: DC | PRN

## 2018-12-10 NOTE — Patient Instructions (Signed)
Low-Sodium Eating Plan Sodium, which is an element that makes up salt, helps you maintain a healthy balance of fluids in your body. Too much sodium can increase your blood pressure and cause fluid and waste to be held in your body. Your health care provider or dietitian may recommend following this plan if you have high blood pressure (hypertension), kidney disease, liver disease, or heart failure. Eating less sodium can help lower your blood pressure, reduce swelling, and protect your heart, liver, and kidneys. What are tips for following this plan? General guidelines  Most people on this plan should limit their sodium intake to 1,500-2,000 mg (milligrams) of sodium each day. Reading food labels   The Nutrition Facts label lists the amount of sodium in one serving of the food. If you eat more than one serving, you must multiply the listed amount of sodium by the number of servings.  Choose foods with less than 140 mg of sodium per serving.  Avoid foods with 300 mg of sodium or more per serving. Shopping  Look for lower-sodium products, often labeled as "low-sodium" or "no salt added."  Always check the sodium content even if foods are labeled as "unsalted" or "no salt added".  Buy fresh foods. ? Avoid canned foods and premade or frozen meals. ? Avoid canned, cured, or processed meats  Buy breads that have less than 80 mg of sodium per slice. Cooking  Eat more home-cooked food and less restaurant, buffet, and fast food.  Avoid adding salt when cooking. Use salt-free seasonings or herbs instead of table salt or sea salt. Check with your health care provider or pharmacist before using salt substitutes.  Cook with plant-based oils, such as canola, sunflower, or olive oil. Meal planning  When eating at a restaurant, ask that your food be prepared with less salt or no salt, if possible.  Avoid foods that contain MSG (monosodium glutamate). MSG is sometimes added to Chinese food,  bouillon, and some canned foods. What foods are recommended? The items listed may not be a complete list. Talk with your dietitian about what dietary choices are best for you. Grains Low-sodium cereals, including oats, puffed wheat and rice, and shredded wheat. Low-sodium crackers. Unsalted rice. Unsalted pasta. Low-sodium bread. Whole-grain breads and whole-grain pasta. Vegetables Fresh or frozen vegetables. "No salt added" canned vegetables. "No salt added" tomato sauce and paste. Low-sodium or reduced-sodium tomato and vegetable juice. Fruits Fresh, frozen, or canned fruit. Fruit juice. Meats and other protein foods Fresh or frozen (no salt added) meat, poultry, seafood, and fish. Low-sodium canned tuna and salmon. Unsalted nuts. Dried peas, beans, and lentils without added salt. Unsalted canned beans. Eggs. Unsalted nut butters. Dairy Milk. Soy milk. Cheese that is naturally low in sodium, such as ricotta cheese, fresh mozzarella, or Swiss cheese Low-sodium or reduced-sodium cheese. Cream cheese. Yogurt. Fats and oils Unsalted butter. Unsalted margarine with no trans fat. Vegetable oils such as canola or olive oils. Seasonings and other foods Fresh and dried herbs and spices. Salt-free seasonings. Low-sodium mustard and ketchup. Sodium-free salad dressing. Sodium-free light mayonnaise. Fresh or refrigerated horseradish. Lemon juice. Vinegar. Homemade, reduced-sodium, or low-sodium soups. Unsalted popcorn and pretzels. Low-salt or salt-free chips. What foods are not recommended? The items listed may not be a complete list. Talk with your dietitian about what dietary choices are best for you. Grains Instant hot cereals. Bread stuffing, pancake, and biscuit mixes. Croutons. Seasoned rice or pasta mixes. Noodle soup cups. Boxed or frozen macaroni and cheese. Regular salted   crackers. Self-rising flour. Vegetables Sauerkraut, pickled vegetables, and relishes. Olives. [place] fries. Onion rings.  Regular canned vegetables (not low-sodium or reduced-sodium). Regular canned tomato sauce and paste (not low-sodium or reduced-sodium). Regular tomato and vegetable juice (not low-sodium or reduced-sodium). Frozen vegetables in sauces. Meats and other protein foods Meat or fish that is salted, canned, smoked, spiced, or pickled. Bacon, ham, sausage, hotdogs, corned beef, chipped beef, packaged lunch meats, salt pork, jerky, pickled herring, anchovies, regular canned tuna, sardines, salted nuts. Dairy Processed cheese and cheese spreads. Cheese curds. Blue cheese. Feta cheese. String cheese. Regular cottage cheese. Buttermilk. Canned milk. Fats and oils Salted butter. Regular margarine. Ghee. Bacon fat. Seasonings and other foods Onion salt, garlic salt, seasoned salt, table salt, and sea salt. Canned and packaged gravies. Worcestershire sauce. Tartar sauce. Barbecue sauce. Teriyaki sauce. Soy sauce, including reduced-sodium. Steak sauce. Fish sauce. Oyster sauce. Cocktail sauce. Horseradish that you find on the shelf. Regular ketchup and mustard. Meat flavorings and tenderizers. Bouillon cubes. Hot sauce and Tabasco sauce. Premade or packaged marinades. Premade or packaged taco seasonings. Relishes. Regular salad dressings. Salsa. Potato and tortilla chips. Corn chips and puffs. Salted popcorn and pretzels. Canned or dried soups. Pizza. Frozen entrees and pot pies. Summary  Eating less sodium can help lower your blood pressure, reduce swelling, and protect your heart, liver, and kidneys.  Most people on this plan should limit their sodium intake to 1,500-2,000 mg (milligrams) of sodium each day.  Canned, boxed, and frozen foods are high in sodium. Restaurant foods, fast foods, and pizza are also very high in sodium. You also get sodium by adding salt to food.  Try to cook at home, eat more fresh fruits and vegetables, and eat less fast food, canned, processed, or prepared foods. This information is  not intended to replace advice given to you by your health care provider. Make sure you discuss any questions you have with your health care provider. Document Released: 11/02/2001 Document Revised: 04/25/2017 Document Reviewed: 05/06/2016 Elsevier Patient Education  2020 [employer].  Hypertension, Adult High blood pressure (hypertension) is when the force of blood pumping through the arteries is too strong. The arteries are the blood vessels that carry blood from the heart throughout the body. Hypertension forces the heart to work harder to pump blood and may cause arteries to become narrow or stiff. Untreated or uncontrolled hypertension can cause a heart attack, heart failure, a stroke, kidney disease, and other problems. A blood pressure reading consists of a higher number over a lower number. Ideally, your blood pressure should be below 120/80. The first ("top") number is called the systolic pressure. It is a measure of the pressure in your arteries as your heart beats. The second ("bottom") number is called the diastolic pressure. It is a measure of the pressure in your arteries as the heart relaxes. What are the causes? The exact cause of this condition is not known. There are some conditions that result in or are related to high blood pressure. What increases the risk? Some risk factors for high blood pressure are under your control. The following factors may make you more likely to develop this condition:  Smoking.  Having type 2 diabetes mellitus, high cholesterol, or both.  Not getting enough exercise or physical activity.  Being overweight.  Having too much fat, sugar, calories, or salt (sodium) in your diet.  Drinking too much alcohol. Some risk factors for high blood pressure may be difficult or impossible to change. Some of these  factors include:  Having chronic kidney disease.  Having a family history of high blood pressure.  Age. Risk increases with age.  Race. You  may be at higher risk if you are African American.  Gender. Men are at higher risk than women before age 71. After age 35, women are at higher risk than men.  Having obstructive sleep apnea.  Stress. What are the signs or symptoms? High blood pressure may not cause symptoms. Very high blood pressure (hypertensive crisis) may cause:  Headache.  Anxiety.  Shortness of breath.  Nosebleed.  Nausea and vomiting.  Vision changes.  Severe chest pain.  Seizures. How is this diagnosed? This condition is diagnosed by measuring your blood pressure while you are seated, with your arm resting on a flat surface, your legs uncrossed, and your feet flat on the floor. The cuff of the blood pressure monitor will be placed directly against the skin of your upper arm at the level of your heart. It should be measured at least twice using the same arm. Certain conditions can cause a difference in blood pressure between your right and left arms. Certain factors can cause blood pressure readings to be lower or higher than normal for a short period of time:  When your blood pressure is higher when you are in a health care provider's office than when you are at home, this is called white coat hypertension. Most people with this condition do not need medicines.  When your blood pressure is higher at home than when you are in a health care provider's office, this is called masked hypertension. Most people with this condition may need medicines to control blood pressure. If you have a high blood pressure reading during one visit or you have normal blood pressure with other risk factors, you may be asked to:  Return on a different day to have your blood pressure checked again.  Monitor your blood pressure at home for 1 week or longer. If you are diagnosed with hypertension, you may have other blood or imaging tests to help your health care provider understand your overall risk for other conditions. How is  this treated? This condition is treated by making healthy lifestyle changes, such as eating healthy foods, exercising more, and reducing your alcohol intake. Your health care provider may prescribe medicine if lifestyle changes are not enough to get your blood pressure under control, and if:  Your systolic blood pressure is above 130.  Your diastolic blood pressure is above 80. Your personal target blood pressure may vary depending on your medical conditions, your age, and other factors. Follow these instructions at home: Eating and drinking   Eat a diet that is high in fiber and potassium, and low in sodium, added sugar, and fat. An example eating plan is called the DASH (Dietary Approaches to Stop Hypertension) diet. To eat this way: ? Eat plenty of fresh fruits and vegetables. Try to fill one half of your plate at each meal with fruits and vegetables. ? Eat whole grains, such as whole-wheat pasta, brown rice, or whole-grain bread. Fill about one fourth of your plate with whole grains. ? Eat or drink low-fat dairy products, such as skim milk or low-fat yogurt. ? Avoid fatty cuts of meat, processed or cured meats, and poultry with skin. Fill about one fourth of your plate with lean proteins, such as fish, chicken without skin, beans, eggs, or tofu. ? Avoid pre-made and processed foods. These tend to be higher in sodium, added  sugar, and fat.  Reduce your daily sodium intake. Most people with hypertension should eat less than 1,500 mg of sodium a day.  Do not drink alcohol if: ? Your health care provider tells you not to drink. ? You are pregnant, may be pregnant, or are planning to become pregnant.  If you drink alcohol: ? Limit how much you use to:  0-1 drink a day for women.  0-2 drinks a day for men. ? Be aware of how much alcohol is in your drink. In the [place] one drink equals one 12 oz bottle of beer (355 mL), one 5 oz glass of wine (148 mL), or one 1 oz glass of hard liquor (44  mL). Lifestyle   Work with your health care provider to maintain a healthy body weight or to lose weight. Ask what an ideal weight is for you.  Get at least 30 minutes of exercise most days of the week. Activities may include walking, swimming, or biking.  Include exercise to strengthen your muscles (resistance exercise), such as Pilates or lifting weights, as part of your weekly exercise routine. Try to do these types of exercises for 30 minutes at least 3 days a week.  Do not use any products that contain nicotine or tobacco, such as cigarettes, e-cigarettes, and chewing tobacco. If you need help quitting, ask your health care provider.  Monitor your blood pressure at home as told by your health care provider.  Keep all follow-up visits as told by your health care provider. This is important. Medicines  Take over-the-counter and prescription medicines only as told by your health care provider. Follow directions carefully. Blood pressure medicines must be taken as prescribed.  Do not skip doses of blood pressure medicine. Doing this puts you at risk for problems and can make the medicine less effective.  Ask your health care provider about side effects or reactions to medicines that you should watch for. Contact a health care provider if you:  Think you are having a reaction to a medicine you are taking.  Have headaches that keep coming back (recurring).  Feel dizzy.  Have swelling in your ankles.  Have trouble with your vision. Get help right away if you:  Develop a severe headache or confusion.  Have unusual weakness or numbness.  Feel faint.  Have severe pain in your chest or abdomen.  Vomit repeatedly.  Have trouble breathing. Summary  Hypertension is when the force of blood pumping through your arteries is too strong. If this condition is not controlled, it may put you at risk for serious complications.  Your personal target blood pressure may vary depending on  your medical conditions, your age, and other factors. For most people, a normal blood pressure is less than 120/80.  Hypertension is treated with lifestyle changes, medicines, or a combination of both. Lifestyle changes include losing weight, eating a healthy, low-sodium diet, exercising more, and limiting alcohol. This information is not intended to replace advice given to you by your health care provider. Make sure you discuss any questions you have with your health care provider. Document Released: 05/13/2005 Document Revised: 01/21/2018 Document Reviewed: 01/21/2018 Elsevier Patient Education  2020 [employer].

## 2018-12-10 NOTE — Progress Notes (Signed)
Virtual Visit via Video Note  I connected with Morgan Gonzalez   on 12/10/18 at 10:40 AM EDT by a video enabled telemedicine application and verified that I am speaking with the correct person using two identifiers.  Location patient: home Location provider:work  Persons participating in the virtual visit: patient, provider  I discussed the limitations of evaluation and management by telemedicine and the availability of in person appointments. The patient expressed understanding and agreed to proceed.   HPI: 1. HTN not on meds x 2 years bP running 130s-140s/90s was on losartan 25 mg qd but made her feel nauseated x 1 week  2. C/o allergies, PND sore throat w/o exposure to covid this happens frequently she uses Flonase which helps some. No red or white changes in her throat   ROS: See pertinent positives and negatives per HPI. General: wt stable  HEENT: +sore throat, +PND  CV: no chest pain  Lungs: no sob  GI: no ab pain  MSK: no joint pain Skin: no issues  Neuro: +h/a  GU: no issues  Psych: no anxiety/depression  Past Medical History:  Diagnosis Date  . Allergic rhinitis   . Hypertension   . Vitamin D deficiency     Past Surgical History:  Procedure Laterality Date  . CESAREAN SECTION     3  . JOINT REPLACEMENT     hip raplacement (right)    Family History  Problem Relation Age of Onset  . Stroke Mother   . Hypertension Mother   . Diabetes Mother   . Hyperlipidemia Mother   . Hypertension Father   . Hyperlipidemia Father   . Brain cancer Father     SOCIAL HX: marriedd with 3 kids    Current Outpatient Medications:  .  amLODipine (NORVASC) 2.5 MG tablet, Take 1 tablet (2.5 mg total) by mouth daily., Disp: 90 tablet, Rfl: 3 .  fluticasone (FLONASE) 50 MCG/ACT nasal spray, Place 2 sprays into both nostrils daily., Disp: 16 g, Rfl: 12 .  loratadine (CLARITIN) 10 MG tablet, Take 1 tablet (10 mg total) by mouth daily as needed for allergies., Disp: 90 tablet, Rfl:  3 .  montelukast (SINGULAIR) 10 MG tablet, Take 1 tablet (10 mg total) by mouth at bedtime., Disp: 90 tablet, Rfl: 3 .  sodium chloride (OCEAN) 0.65 % SOLN nasal spray, Place 1 spray into both nostrils as needed for congestion., Disp: 30 mL, Rfl: 12  EXAM:  VITALS per patient if applicable:  GENERAL: alert, oriented, appears well and in no acute distress  HEENT: atraumatic, conjunttiva clear, no obvious abnormalities on inspection of external nose and ears  NECK: normal movements of the head and neck  LUNGS: on inspection no signs of respiratory distress, breathing rate appears normal, no obvious gross SOB, gasping or wheezing  CV: no obvious cyanosis  MS: moves all visible extremities without noticeable abnormality  PSYCH/NEURO: pleasant and cooperative, no obvious depression or anxiety, speech and thought processing grossly intact  ASSESSMENT AND PLAN:  Discussed the following assessment and plan:  Essential hypertension - Plan: Comprehensive metabolic panel, CBC w/Diff, Lipid panel, Urinalysis, Routine w reflex microscopic, amLODipine (NORVASC) 2.5 MG tablet was on losartan 25 mg qd but made her feel nauseated   Allergic rhinitis, unspecified seasonality, unspecified trigger - Plan: montelukast (SINGULAIR) 10 MG tablet, loratadine (CLARITIN) 10 MG tablet, sodium chloride (OCEAN) 0.65 % SOLN nasal spray, fluticasone (FLONASE) 50 MCG/ACT nasal spray, if this does not work try atrovent Rx   Vitamin D deficiency -  Plan: Vitamin D (25 hydroxy)  Hyperglycemia - Plan: Hemoglobin A1c  HM Declines flu shot  Tdap check Dr. Garwin Gonzalez  Pap  Mammogram -she reports gets q 2 years with ob/gyn seeing since 1995  -pt will call to sch pap and mammo with Dr. Garwin Gonzalez   Colonoscopy vs cologuard disc today pt will disc with husband and let me know  Fasting labs 12/21/18 8:45 am  On vitamin D 2000 IU qd     I discussed the assessment and treatment plan with the patient. The patient was  provided an opportunity to ask questions and all were answered. The patient agreed with the plan and demonstrated an understanding of the instructions.   The patient was advised to call back or seek an in-person evaluation if the symptoms worsen or if the condition fails to improve as anticipated.  Time spent 25 minutes  Morgan Jackson, MD

## 2018-12-21 ENCOUNTER — Other Ambulatory Visit: Payer: Self-pay

## 2018-12-21 ENCOUNTER — Encounter: Payer: Self-pay | Admitting: Internal Medicine

## 2018-12-21 ENCOUNTER — Other Ambulatory Visit (INDEPENDENT_AMBULATORY_CARE_PROVIDER_SITE_OTHER): Payer: BC Managed Care – PPO

## 2018-12-21 DIAGNOSIS — E559 Vitamin D deficiency, unspecified: Secondary | ICD-10-CM | POA: Diagnosis not present

## 2018-12-21 DIAGNOSIS — Z1329 Encounter for screening for other suspected endocrine disorder: Secondary | ICD-10-CM

## 2018-12-21 DIAGNOSIS — R739 Hyperglycemia, unspecified: Secondary | ICD-10-CM

## 2018-12-21 DIAGNOSIS — Z1389 Encounter for screening for other disorder: Secondary | ICD-10-CM

## 2018-12-21 DIAGNOSIS — I10 Essential (primary) hypertension: Secondary | ICD-10-CM | POA: Diagnosis not present

## 2018-12-21 DIAGNOSIS — E1169 Type 2 diabetes mellitus with other specified complication: Secondary | ICD-10-CM | POA: Insufficient documentation

## 2018-12-21 DIAGNOSIS — R7303 Prediabetes: Secondary | ICD-10-CM | POA: Insufficient documentation

## 2018-12-21 DIAGNOSIS — E785 Hyperlipidemia, unspecified: Secondary | ICD-10-CM | POA: Insufficient documentation

## 2018-12-21 LAB — CBC WITH DIFFERENTIAL/PLATELET
Basophils Absolute: 0 10*3/uL (ref 0.0–0.1)
Basophils Relative: 0.4 % (ref 0.0–3.0)
Eosinophils Absolute: 0.1 10*3/uL (ref 0.0–0.7)
Eosinophils Relative: 1.5 % (ref 0.0–5.0)
HCT: 42.9 % (ref 36.0–46.0)
Hemoglobin: 14.3 g/dL (ref 12.0–15.0)
Lymphocytes Relative: 28 % (ref 12.0–46.0)
Lymphs Abs: 2.1 10*3/uL (ref 0.7–4.0)
MCHC: 33.3 g/dL (ref 30.0–36.0)
MCV: 89 fl (ref 78.0–100.0)
Monocytes Absolute: 0.6 10*3/uL (ref 0.1–1.0)
Monocytes Relative: 8.2 % (ref 3.0–12.0)
Neutro Abs: 4.7 10*3/uL (ref 1.4–7.7)
Neutrophils Relative %: 61.9 % (ref 43.0–77.0)
Platelets: 270 10*3/uL (ref 150.0–400.0)
RBC: 4.82 Mil/uL (ref 3.87–5.11)
RDW: 12.7 % (ref 11.5–15.5)
WBC: 7.5 10*3/uL (ref 4.0–10.5)

## 2018-12-21 LAB — COMPREHENSIVE METABOLIC PANEL
ALT: 54 U/L — ABNORMAL HIGH (ref 0–35)
AST: 24 U/L (ref 0–37)
Albumin: 4.2 g/dL (ref 3.5–5.2)
Alkaline Phosphatase: 66 U/L (ref 39–117)
BUN: 12 mg/dL (ref 6–23)
CO2: 27 mEq/L (ref 19–32)
Calcium: 9.2 mg/dL (ref 8.4–10.5)
Chloride: 103 mEq/L (ref 96–112)
Creatinine, Ser: 0.71 mg/dL (ref 0.40–1.20)
GFR: 86.67 mL/min (ref >60.00)
Glucose, Bld: 109 mg/dL — ABNORMAL HIGH (ref 70–99)
Potassium: 3.8 mEq/L (ref 3.5–5.1)
Sodium: 139 mEq/L (ref 135–145)
Total Bilirubin: 0.5 mg/dL (ref 0.2–1.2)
Total Protein: 7.1 g/dL (ref 6.0–8.3)

## 2018-12-21 LAB — LIPID PANEL
Cholesterol: 215 mg/dL — ABNORMAL HIGH (ref 0–200)
HDL: 44.6 mg/dL (ref >39.00)
LDL Cholesterol: 132 mg/dL — ABNORMAL HIGH (ref 0–99)
NonHDL: 170.4
Total CHOL/HDL Ratio: 5
Triglycerides: 191 mg/dL — ABNORMAL HIGH (ref 0.0–149.0)
VLDL: 38.2 mg/dL (ref 0.0–40.0)

## 2018-12-21 LAB — TSH: TSH: 1.33 u[IU]/mL (ref 0.35–4.50)

## 2018-12-21 LAB — T4, FREE: Free T4: 0.78 ng/dL (ref 0.60–1.60)

## 2018-12-21 LAB — HEMOGLOBIN A1C: Hgb A1c MFr Bld: 6.2 % (ref 4.6–6.5)

## 2018-12-21 LAB — VITAMIN D 25 HYDROXY (VIT D DEFICIENCY, FRACTURES): VITD: 23.14 ng/mL — ABNORMAL LOW (ref 30.00–100.00)

## 2018-12-22 LAB — URINALYSIS, ROUTINE W REFLEX MICROSCOPIC
Bacteria, UA: NONE SEEN /HPF
Bilirubin Urine: NEGATIVE
Glucose, UA: NEGATIVE
Hyaline Cast: NONE SEEN /LPF
Ketones, ur: NEGATIVE
Leukocytes,Ua: NEGATIVE
Nitrite: NEGATIVE
Protein, ur: NEGATIVE
Specific Gravity, Urine: 1.023 (ref 1.001–1.03)
WBC, UA: NONE SEEN /HPF (ref 0–5)
pH: <=5 (ref 5.0–8.0)

## 2018-12-23 ENCOUNTER — Other Ambulatory Visit: Payer: Self-pay | Admitting: Internal Medicine

## 2018-12-23 DIAGNOSIS — R748 Abnormal levels of other serum enzymes: Secondary | ICD-10-CM

## 2018-12-30 ENCOUNTER — Other Ambulatory Visit: Payer: Self-pay

## 2018-12-30 ENCOUNTER — Ambulatory Visit
Admission: RE | Admit: 2018-12-30 | Discharge: 2018-12-30 | Disposition: A | Discharge status: Home / Self Care | Payer: BC Managed Care – PPO | Source: Ambulatory Visit | Attending: Internal Medicine | Admitting: Internal Medicine

## 2018-12-30 DIAGNOSIS — R7989 Other specified abnormal findings of blood chemistry: Secondary | ICD-10-CM | POA: Diagnosis not present

## 2018-12-30 DIAGNOSIS — R748 Abnormal levels of other serum enzymes: Secondary | ICD-10-CM | POA: Diagnosis not present

## 2019-01-01 ENCOUNTER — Other Ambulatory Visit: Payer: Self-pay | Admitting: Internal Medicine

## 2019-01-01 DIAGNOSIS — Z1159 Encounter for screening for other viral diseases: Secondary | ICD-10-CM

## 2019-01-01 DIAGNOSIS — Z13818 Encounter for screening for other digestive system disorders: Secondary | ICD-10-CM

## 2019-01-01 DIAGNOSIS — K760 Fatty (change of) liver, not elsewhere classified: Secondary | ICD-10-CM

## 2019-01-06 ENCOUNTER — Telehealth: Payer: Self-pay

## 2019-01-06 NOTE — Telephone Encounter (Signed)
Copied from [place] [id number]. Topic: General - Other >> Jan 06, 2019  8:12 AM Leward Quan A wrote: Reason for CRM: Patient called she have an appointment at 2.30 pm for labs asking can she come in at 2.00 pm or 2.15 pm instead she have another appointment with her son please call patient and let her know if its ok to come in Thank you. Ph# [phone]

## 2019-01-06 NOTE — Telephone Encounter (Signed)
Sorry reschedule appt for labs if needed

## 2019-01-06 NOTE — Telephone Encounter (Signed)
Called and spoke w/ pt.  Pt informed that it is ok to come in at 2:15 for labs today since 2:15 was the recommended time for check in anyway.

## 2019-01-07 ENCOUNTER — Other Ambulatory Visit: Payer: BC Managed Care – PPO

## 2019-01-12 ENCOUNTER — Other Ambulatory Visit: Payer: Self-pay

## 2019-01-12 ENCOUNTER — Other Ambulatory Visit (INDEPENDENT_AMBULATORY_CARE_PROVIDER_SITE_OTHER): Payer: BC Managed Care – PPO

## 2019-01-12 DIAGNOSIS — Z13818 Encounter for screening for other digestive system disorders: Secondary | ICD-10-CM | POA: Diagnosis not present

## 2019-01-12 DIAGNOSIS — Z1159 Encounter for screening for other viral diseases: Secondary | ICD-10-CM | POA: Diagnosis not present

## 2019-01-12 DIAGNOSIS — K760 Fatty (change of) liver, not elsewhere classified: Secondary | ICD-10-CM

## 2019-01-13 LAB — HEPATITIS C ANTIBODY
Hepatitis C Ab: NONREACTIVE
SIGNAL TO CUT-OFF: 0.01 (ref <1.00)

## 2019-01-13 LAB — HEPATITIS B SURFACE ANTIBODY, QUANTITATIVE: Hep B S AB Quant (Post): <5 m[IU]/mL — ABNORMAL LOW (ref >=10)

## 2019-01-13 LAB — HEPATITIS B SURFACE ANTIGEN: Hepatitis B Surface Ag: NONREACTIVE

## 2019-01-13 LAB — HEPATITIS A ANTIBODY, TOTAL: Hepatitis A AB,Total: NONREACTIVE

## 2019-01-14 ENCOUNTER — Encounter: Payer: BC Managed Care – PPO | Admitting: Internal Medicine

## 2019-01-14 ENCOUNTER — Encounter: Payer: Self-pay | Admitting: *Deleted

## 2019-01-14 DIAGNOSIS — Z0289 Encounter for other administrative examinations: Secondary | ICD-10-CM

## 2019-02-09 ENCOUNTER — Encounter: Payer: BC Managed Care – PPO | Admitting: Internal Medicine

## 2019-03-08 DIAGNOSIS — Z01419 Encounter for gynecological examination (general) (routine) without abnormal findings: Secondary | ICD-10-CM | POA: Diagnosis not present

## 2019-03-08 DIAGNOSIS — Z1151 Encounter for screening for human papillomavirus (HPV): Secondary | ICD-10-CM | POA: Diagnosis not present

## 2019-03-08 DIAGNOSIS — Z6832 Body mass index (BMI) 32.0-32.9, adult: Secondary | ICD-10-CM | POA: Diagnosis not present

## 2019-03-08 DIAGNOSIS — Z1231 Encounter for screening mammogram for malignant neoplasm of breast: Secondary | ICD-10-CM | POA: Diagnosis not present

## 2019-03-08 LAB — HM PAP SMEAR: HM Pap smear: NORMAL

## 2019-03-08 LAB — HM MAMMOGRAPHY

## 2019-03-11 ENCOUNTER — Encounter: Payer: Self-pay | Admitting: Internal Medicine

## 2019-03-12 ENCOUNTER — Encounter: Payer: Self-pay | Admitting: Internal Medicine

## 2019-09-01 ENCOUNTER — Ambulatory Visit: Payer: Self-pay | Admitting: Urology

## 2019-09-22 ENCOUNTER — Ambulatory Visit: Payer: Self-pay | Admitting: Urology

## 2019-11-12 ENCOUNTER — Other Ambulatory Visit: Payer: Self-pay

## 2019-11-12 ENCOUNTER — Ambulatory Visit (INDEPENDENT_AMBULATORY_CARE_PROVIDER_SITE_OTHER): Payer: Managed Care, Other (non HMO) | Admitting: Internal Medicine

## 2019-11-12 ENCOUNTER — Encounter: Payer: Self-pay | Admitting: Internal Medicine

## 2019-11-12 VITALS — BP 130/84 | HR 92 | Temp 97.4°F | Ht 63.0 in | Wt 183.4 lb

## 2019-11-12 DIAGNOSIS — Z0000 Encounter for general adult medical examination without abnormal findings: Secondary | ICD-10-CM | POA: Diagnosis not present

## 2019-11-12 DIAGNOSIS — E66811 Obesity, class 1: Secondary | ICD-10-CM

## 2019-11-12 DIAGNOSIS — Z23 Encounter for immunization: Secondary | ICD-10-CM | POA: Diagnosis not present

## 2019-11-12 DIAGNOSIS — E785 Hyperlipidemia, unspecified: Secondary | ICD-10-CM

## 2019-11-12 DIAGNOSIS — R748 Abnormal levels of other serum enzymes: Secondary | ICD-10-CM

## 2019-11-12 DIAGNOSIS — K760 Fatty (change of) liver, not elsewhere classified: Secondary | ICD-10-CM

## 2019-11-12 DIAGNOSIS — Z1329 Encounter for screening for other suspected endocrine disorder: Secondary | ICD-10-CM

## 2019-11-12 DIAGNOSIS — R7303 Prediabetes: Secondary | ICD-10-CM | POA: Diagnosis not present

## 2019-11-12 DIAGNOSIS — E559 Vitamin D deficiency, unspecified: Secondary | ICD-10-CM

## 2019-11-12 DIAGNOSIS — I10 Essential (primary) hypertension: Secondary | ICD-10-CM | POA: Diagnosis not present

## 2019-11-12 DIAGNOSIS — Z1389 Encounter for screening for other disorder: Secondary | ICD-10-CM

## 2019-11-12 DIAGNOSIS — E669 Obesity, unspecified: Secondary | ICD-10-CM

## 2019-11-12 MED ORDER — AMLODIPINE BESYLATE 2.5 MG PO TABS: 2.5000 mg | ORAL_TABLET | Freq: Every day | ORAL | 3 refills | Status: DC

## 2019-11-12 NOTE — Progress Notes (Signed)
Chief Complaint  Patient presents with  . Annual Exam   Annual doing well  1.HTN norvasc stopped 2.5 dose made her feel nauseated so did losartan 25 mg in the past agreeable to try norvasc 2.5 again  2. Nasal congestion worse at night takes benadryl 25 mg qhs prn and ns and flonase which help singulair and claritin did not help consider ENT referral disc today  3. Prediabetes and obesity and HLD mother in law on zocor 10 mg   Review of Systems  Constitutional: Negative for weight loss.  HENT: Negative for hearing loss.   Eyes: Negative for blurred vision.  Respiratory: Negative for shortness of breath.   Cardiovascular: Negative for chest pain.  Gastrointestinal: Negative for abdominal pain.  Musculoskeletal: Negative for falls.  Skin: Negative for rash.  Neurological: Negative for headaches.  Psychiatric/Behavioral: Negative for depression.       +anxiety with MD appts    Past Medical History:  Diagnosis Date  . Allergic rhinitis   . Hypertension   . Vitamin D deficiency    Past Surgical History:  Procedure Laterality Date  . CESAREAN SECTION     3  . JOINT REPLACEMENT     hip raplacement (right)   Family History  Problem Relation Age of Onset  . Stroke Mother   . Hypertension Mother   . Diabetes Mother   . Hyperlipidemia Mother   . Hypertension Father   . Hyperlipidemia Father   . Brain cancer Father    Social History   Socioeconomic History  . Marital status: Married    Spouse name: Not on file  . Number of children: Not on file  . Years of education: Not on file  . Highest education level: Not on file  Occupational History  . Not on file  Tobacco Use  . Smoking status: Never Smoker  . Smokeless tobacco: Never Used  Substance and Sexual Activity  . Alcohol use: Yes    Alcohol/week: 1.0 standard drink    Types: 1 Standard drinks or equivalent per week  . Drug use: No  . Sexual activity: Not Currently    Partners: Male  Other Topics Concern  . Not  on file  Social History Narrative   3 kids    Married    Works with school    Husband [employer]    Social Determinants of Health   Financial Resource Strain:   . Difficulty of Paying Living Expenses:   Food Insecurity:   . Worried About Charity fundraiser in the Last Year:   . Arboriculturist in the Last Year:   Transportation Needs:   . Film/video editor (Medical):   Marland Kitchen Lack of Transportation (Non-Medical):   Physical Activity:   . Days of Exercise per Week:   . Minutes of Exercise per Session:   Stress:   . Feeling of Stress :   Social Connections:   . Frequency of Communication with Friends and Family:   . Frequency of Social Gatherings with Friends and Family:   . Attends Religious Services:   . Active Member of Clubs or Organizations:   . Attends Archivist Meetings:   Marland Kitchen Marital Status:   Intimate Partner Violence:   . Fear of Current or Ex-Partner:   . Emotionally Abused:   Marland Kitchen Physically Abused:   . Sexually Abused:    Current Meds  Medication Sig  . diphenhydrAMINE (BENADRYL) 25 MG tablet Take 25 mg by mouth at  bedtime as needed.  . fluticasone (FLONASE) 50 MCG/ACT nasal spray Place 2 sprays into both nostrils daily.  . sodium chloride (OCEAN) 0.65 % SOLN nasal spray Place 1 spray into both nostrils as needed for congestion.  . [DISCONTINUED] loratadine (CLARITIN) 10 MG tablet Take 1 tablet (10 mg total) by mouth daily as needed for allergies.  . [DISCONTINUED] montelukast (SINGULAIR) 10 MG tablet Take 1 tablet (10 mg total) by mouth at bedtime.   No Known Allergies No results found for this or any previous visit (from the past 2160 hour(s)). Objective  Body mass index is 32.49 kg/m. Wt Readings from Last 3 Encounters:  11/12/19 183 lb 6.4 oz (83.2 kg)  12/10/18 182 lb (82.6 kg)  09/15/15 170 lb (77.1 kg)   Temp Readings from Last 3 Encounters:  11/12/19 (!) 97.4 F (36.3 C) (Temporal)  09/15/15 98.5 F (36.9 C) (Oral)  09/12/15 99.2 F  (37.3 C) (Oral)   BP Readings from Last 3 Encounters:  11/12/19 130/84  09/15/15 136/88  09/12/15 122/76   Pulse Readings from Last 3 Encounters:  11/12/19 92  09/15/15 91  09/12/15 78    Physical Exam Vitals and nursing note reviewed.  Constitutional:      Appearance: Normal appearance. She is well-developed and well-groomed. She is obese.  HENT:     Head: Normocephalic and atraumatic.  Eyes:     Conjunctiva/sclera: Conjunctivae normal.     Pupils: Pupils are equal, round, and reactive to light.  Cardiovascular:     Rate and Rhythm: Normal rate and regular rhythm.     Heart sounds: Normal heart sounds. No murmur heard.   Pulmonary:     Effort: Pulmonary effort is normal.     Breath sounds: Normal breath sounds.  Skin:    General: Skin is warm and dry.  Neurological:     General: No focal deficit present.     Mental Status: She is alert and oriented to person, place, and time. Mental status is at baseline.     Gait: Gait normal.  Psychiatric:        Attention and Perception: Attention and perception normal.        Mood and Affect: Mood and affect normal.        Speech: Speech normal.        Behavior: Behavior normal. Behavior is cooperative.        Thought Content: Thought content normal.        Cognition and Memory: Cognition normal.        Judgment: Judgment normal.     Assessment  Plan  Annual physical exam Fasting labs 12/21/19  Declines flu shot  covid 2/2 moderna   Tdap check Dr. Garwin Brothers  Pap nl 03/08/19 Dr. Garwin Brothers Mammogram -she reports gets q 2 years with ob/gyn seeing since 1995  -03/07/21    Colonoscopy vs cologuard disc today pt will disc with husband and let me know again as of 11/12/19   rec healthy diet and exercise   On vitamin D 2000 IU qd   Essential hypertension - Plan: amLODipine (NORVASC) 2.5 MG tablet, Comprehensive metabolic panel, Lipid panel, CBC with Differential/Platelet F/u in 6 months   Hyperlipidemia, unspecified  hyperlipidemia type - Plan: Lipid panel Prediabetes - Plan: Hemoglobin  obesity rec healthy diet and exercise   Fatty liver Elevated liver enzymes Consider twinrix in future   Vitamin D deficiency - Plan: Vitamin D (25 hydroxy)   Provider: Dr. Olivia Mackie McLean-Scocuzza-[facility] Medicine

## 2019-11-12 NOTE — Patient Instructions (Addendum)
[facility] app   cologuard CPT [phone] ask insurance cost  Do the test on Monday   Cost colonoscopy   Read about metoprolol or bisoprolol  DASH Eating Plan DASH stands for "Dietary Approaches to Stop Hypertension." The DASH eating plan is a healthy eating plan that has been shown to reduce high blood pressure (hypertension). It may also reduce your risk for type 2 diabetes, heart disease, and stroke. The DASH eating plan may also help with weight loss. What are tips for following this plan?  General guidelines  Avoid eating more than 2,300 mg (milligrams) of salt (sodium) a day. If you have hypertension, you may need to reduce your sodium intake to 1,500 mg a day.  Limit alcohol intake to no more than 1 drink a day for nonpregnant women and 2 drinks a day for men. One drink equals 12 oz of beer, 5 oz of wine, or 1 oz of hard liquor.  Work with your health care provider to maintain a healthy body weight or to lose weight. Ask what an ideal weight is for you.  Get at least 30 minutes of exercise that causes your heart to beat faster (aerobic exercise) most days of the week. Activities may include walking, swimming, or biking.  Work with your health care provider or diet and nutrition specialist (dietitian) to adjust your eating plan to your individual calorie needs. Reading food labels   Check food labels for the amount of sodium per serving. Choose foods with less than 5 percent of the Daily Value of sodium. Generally, foods with less than 300 mg of sodium per serving fit into this eating plan.  To find whole grains, look for the word "whole" as the first word in the ingredient list. Shopping  Buy products labeled as "low-sodium" or "no salt added."  Buy fresh foods. Avoid canned foods and premade or frozen meals. Cooking  Avoid adding salt when cooking. Use salt-free seasonings or herbs instead of table salt or sea salt. Check with your health care provider or pharmacist before using  salt substitutes.  Do not fry foods. Cook foods using healthy methods such as baking, boiling, grilling, and broiling instead.  Cook with heart-healthy oils, such as olive, canola, soybean, or sunflower oil. Meal planning  Eat a balanced diet that includes: ? 5 or more servings of fruits and vegetables each day. At each meal, try to fill half of your plate with fruits and vegetables. ? Up to 6-8 servings of whole grains each day. ? Less than 6 oz of lean meat, poultry, or fish each day. A 3-oz serving of meat is about the same size as a deck of cards. One egg equals 1 oz. ? 2 servings of low-fat dairy each day. ? A serving of nuts, seeds, or beans 5 times each week. ? Heart-healthy fats. Healthy fats called Omega-3 fatty acids are found in foods such as flaxseeds and coldwater fish, like sardines, salmon, and mackerel.  Limit how much you eat of the following: ? Canned or prepackaged foods. ? Food that is high in trans fat, such as fried foods. ? Food that is high in saturated fat, such as fatty meat. ? Sweets, desserts, sugary drinks, and other foods with added sugar. ? Full-fat dairy products.  Do not salt foods before eating.  Try to eat at least 2 vegetarian meals each week.  Eat more home-cooked food and less restaurant, buffet, and fast food.  When eating at a restaurant, ask that your food  be prepared with less salt or no salt, if possible. What foods are recommended? The items listed may not be a complete list. Talk with your dietitian about what dietary choices are best for you. Grains Whole-grain or whole-wheat bread. Whole-grain or whole-wheat pasta. Brown rice. [place]. Bulgur. Whole-grain and low-sodium cereals. Pita bread. Low-fat, low-sodium crackers. Whole-wheat flour tortillas. Vegetables Fresh or frozen vegetables (raw, steamed, roasted, or grilled). Low-sodium or reduced-sodium tomato and vegetable juice. Low-sodium or reduced-sodium tomato sauce and  tomato paste. Low-sodium or reduced-sodium canned vegetables. Fruits All fresh, dried, or frozen fruit. Canned fruit in natural juice (without added sugar). Meat and other protein foods Skinless chicken or [place]. Ground chicken or [place]. Pork with fat trimmed off. Fish and seafood. Egg whites. Dried beans, peas, or lentils. Unsalted nuts, nut butters, and seeds. Unsalted canned beans. Lean cuts of beef with fat trimmed off. Low-sodium, lean deli meat. Dairy Low-fat (1%) or fat-free (skim) milk. Fat-free, low-fat, or reduced-fat cheeses. Nonfat, low-sodium ricotta or cottage cheese. Low-fat or nonfat yogurt. Low-fat, low-sodium cheese. Fats and oils Soft margarine without trans fats. Vegetable oil. Low-fat, reduced-fat, or light mayonnaise and salad dressings (reduced-sodium). Canola, safflower, olive, soybean, and sunflower oils. Avocado. Seasoning and other foods Herbs. Spices. Seasoning mixes without salt. Unsalted popcorn and pretzels. Fat-free sweets. What foods are not recommended? The items listed may not be a complete list. Talk with your dietitian about what dietary choices are best for you. Grains Baked goods made with fat, such as croissants, muffins, or some breads. Dry pasta or rice meal packs. Vegetables Creamed or fried vegetables. Vegetables in a cheese sauce. Regular canned vegetables (not low-sodium or reduced-sodium). Regular canned tomato sauce and paste (not low-sodium or reduced-sodium). Regular tomato and vegetable juice (not low-sodium or reduced-sodium). Angie Fava. Olives. Fruits Canned fruit in a light or heavy syrup. Fried fruit. Fruit in cream or butter sauce. Meat and other protein foods Fatty cuts of meat. Ribs. Fried meat. Berniece Salines. Sausage. Bologna and other processed lunch meats. Salami. Fatback. Hotdogs. Bratwurst. Salted nuts and seeds. Canned beans with added salt. Canned or smoked fish. Whole eggs or egg yolks. Chicken or [place] with skin. Dairy Whole or 2%  milk, cream, and half-and-half. Whole or full-fat cream cheese. Whole-fat or sweetened yogurt. Full-fat cheese. Nondairy creamers. Whipped toppings. Processed cheese and cheese spreads. Fats and oils Butter. Stick margarine. Lard. Shortening. Ghee. Bacon fat. Tropical oils, such as coconut, palm kernel, or palm oil. Seasoning and other foods Salted popcorn and pretzels. Onion salt, garlic salt, seasoned salt, table salt, and sea salt. Worcestershire sauce. Tartar sauce. Barbecue sauce. Teriyaki sauce. Soy sauce, including reduced-sodium. Steak sauce. Canned and packaged gravies. Fish sauce. Oyster sauce. Cocktail sauce. Horseradish that you find on the shelf. Ketchup. Mustard. Meat flavorings and tenderizers. Bouillon cubes. Hot sauce and Tabasco sauce. Premade or packaged marinades. Premade or packaged taco seasonings. Relishes. Regular salad dressings. Where to find more information:  National Heart, Lung, and [place]: https://wilson-eaton.com/  American Heart Association: www.heart.org Summary  The DASH eating plan is a healthy eating plan that has been shown to reduce high blood pressure (hypertension). It may also reduce your risk for type 2 diabetes, heart disease, and stroke.  With the DASH eating plan, you should limit salt (sodium) intake to 2,300 mg a day. If you have hypertension, you may need to reduce your sodium intake to 1,500 mg a day.  When on the DASH eating plan, aim to eat more fresh fruits and vegetables, whole  grains, lean proteins, low-fat dairy, and heart-healthy fats.  Work with your health care provider or diet and nutrition specialist (dietitian) to adjust your eating plan to your individual calorie needs. This information is not intended to replace advice given to you by your health care provider. Make sure you discuss any questions you have with your health care provider. Document Revised: 04/25/2017 Document Reviewed: 05/06/2016 Elsevier Patient Education  [place].  High Cholesterol  High cholesterol is a condition in which the blood has high levels of a white, waxy, fat-like substance (cholesterol). The human body needs small amounts of cholesterol. The liver makes all the cholesterol that the body needs. Extra (excess) cholesterol comes from the food that we eat. Cholesterol is carried from the liver by the blood through the blood vessels. If you have high cholesterol, deposits (plaques) may build up on the walls of your blood vessels (arteries). Plaques make the arteries narrower and stiffer. Cholesterol plaques increase your risk for heart attack and stroke. Work with your health care provider to keep your cholesterol levels in a healthy range. What increases the risk? This condition is more likely to develop in people who:  Eat foods that are high in animal fat (saturated fat) or cholesterol.  Are overweight.  Are not getting enough exercise.  Have a family history of high cholesterol. What are the signs or symptoms? There are no symptoms of this condition. How is this diagnosed? This condition may be diagnosed from the results of a blood test.  If you are older than age 46, your health care provider may check your cholesterol every 4-6 years.  You may be checked more often if you already have high cholesterol or other risk factors for heart disease. The blood test for cholesterol measures:  "Bad" cholesterol (LDL cholesterol). This is the main type of cholesterol that causes heart disease. The desired level for LDL is less than 100.  "Good" cholesterol (HDL cholesterol). This type helps to protect against heart disease by cleaning the arteries and carrying the LDL away. The desired level for HDL is 60 or higher.  Triglycerides. These are fats that the body can store or burn for energy. The desired number for triglycerides is lower than 150.  Total cholesterol. This is a measure of the total amount of cholesterol in your  blood, including LDL cholesterol, HDL cholesterol, and triglycerides. A healthy number is less than 200. How is this treated? This condition is treated with diet changes, lifestyle changes, and medicines. Diet changes  This may include eating more whole grains, fruits, vegetables, nuts, and fish.  This may also include cutting back on red meat and foods that have a lot of added sugar. Lifestyle changes  Changes may include getting at least 40 minutes of aerobic exercise 3 times a week. Aerobic exercises include walking, biking, and swimming. Aerobic exercise along with a healthy diet can help you maintain a healthy weight.  Changes may also include quitting smoking. Medicines  Medicines are usually given if diet and lifestyle changes have failed to reduce your cholesterol to healthy levels.  Your health care provider may prescribe a statin medicine. Statin medicines have been shown to reduce cholesterol, which can reduce the risk of heart disease. Follow these instructions at home: Eating and drinking If told by your health care provider:  Eat chicken (without skin), fish, veal, shellfish, ground [place] breast, and round or loin cuts of red meat.  Do not eat fried foods or fatty meats,  such as hot dogs and salami.  Eat plenty of fruits, such as apples.  Eat plenty of vegetables, such as broccoli, potatoes, and carrots.  Eat beans, peas, and lentils.  Eat grains such as barley, rice, couscous, and bulgur wheat.  Eat pasta without cream sauces.  Use skim or nonfat milk, and eat low-fat or nonfat yogurt and cheeses.  Do not eat or drink whole milk, cream, ice cream, egg yolks, or hard cheeses.  Do not eat stick margarine or tub margarines that contain trans fats (also called partially hydrogenated oils).  Do not eat saturated tropical oils, such as coconut oil and palm oil.  Do not eat cakes, cookies, crackers, or other baked goods that contain trans fats.  General  instructions  Exercise as directed by your health care provider. Increase your activity level with activities such as gardening, walking, and taking the stairs.  Take over-the-counter and prescription medicines only as told by your health care provider.  Do not use any products that contain nicotine or tobacco, such as cigarettes and e-cigarettes. If you need help quitting, ask your health care provider.  Keep all follow-up visits as told by your health care provider. This is important. Contact a health care provider if:  You are struggling to maintain a healthy diet or weight.  You need help to start on an exercise program.  You need help to stop smoking. Get help right away if:  You have chest pain.  You have trouble breathing. This information is not intended to replace advice given to you by your health care provider. Make sure you discuss any questions you have with your health care provider. Document Revised: 05/16/2017 Document Reviewed: 11/11/2015 Elsevier Patient Education  [place].  Cholesterol Content in Foods Cholesterol is a waxy, fat-like substance that helps to carry fat in the blood. The body needs cholesterol in small amounts, but too much cholesterol can cause damage to the arteries and heart. Most people should eat less than 200 milligrams (mg) of cholesterol a day. Foods with cholesterol  Cholesterol is found in animal-based foods, such as meat, seafood, and dairy. Generally, low-fat dairy and lean meats have less cholesterol than full-fat dairy and fatty meats. The milligrams of cholesterol per serving (mg per serving) of common cholesterol-containing foods are listed below. Meat and other proteins  Egg -- one large whole egg has 186 mg.  Veal shank -- 4 oz has 141 mg.  Lean ground [place] (93% lean) -- 4 oz has 118 mg.  Fat-trimmed lamb loin -- 4 oz has 106 mg.  Lean ground beef (90% lean) -- 4 oz has 100 mg.  Lobster -- 3.5 oz has 90  mg.  Pork loin chops -- 4 oz has 86 mg.  Canned salmon -- 3.5 oz has 83 mg.  Fat-trimmed beef top loin -- 4 oz has 78 mg.  Frankfurter -- 1 frank (3.5 oz) has 77 mg.  Crab -- 3.5 oz has 71 mg.  Roasted chicken without skin, white meat -- 4 oz has 66 mg.  Light bologna -- 2 oz has 45 mg.  Deli-cut [place] -- 2 oz has 31 mg.  Canned tuna -- 3.5 oz has 31 mg.  Berniece Salines -- 1 oz has 29 mg.  Oysters and mussels (raw) -- 3.5 oz has 25 mg.  Mackerel -- 1 oz has 22 mg.  Trout -- 1 oz has 20 mg.  Pork sausage -- 1 link (1 oz) has 17 mg.  Salmon -- 1  oz has 16 mg.  Tilapia -- 1 oz has 14 mg. Dairy  Soft-serve ice cream --  cup (4 oz) has 103 mg.  Whole-milk yogurt -- 1 cup (8 oz) has 29 mg.  Cheddar cheese -- 1 oz has 28 mg.  American cheese -- 1 oz has 28 mg.  Whole milk -- 1 cup (8 oz) has 23 mg.  2% milk -- 1 cup (8 oz) has 18 mg.  Cream cheese -- 1 tablespoon (Tbsp) has 15 mg.  Cottage cheese --  cup (4 oz) has 14 mg.  Low-fat (1%) milk -- 1 cup (8 oz) has 10 mg.  Sour cream -- 1 Tbsp has 8.5 mg.  Low-fat yogurt -- 1 cup (8 oz) has 8 mg.  Nonfat Greek yogurt -- 1 cup (8 oz) has 7 mg.  Half-and-half cream -- 1 Tbsp has 5 mg. Fats and oils  Cod liver oil -- 1 tablespoon (Tbsp) has 82 mg.  Butter -- 1 Tbsp has 15 mg.  Lard -- 1 Tbsp has 14 mg.  Bacon grease -- 1 Tbsp has 14 mg.  Mayonnaise -- 1 Tbsp has 5-10 mg.  Margarine -- 1 Tbsp has 3-10 mg. Exact amounts of cholesterol in these foods may vary depending on specific ingredients and brands. Foods without cholesterol Most plant-based foods do not have cholesterol unless you combine them with a food that has cholesterol. Foods without cholesterol include:  Grains and cereals.  Vegetables.  Fruits.  Vegetable oils, such as olive, canola, and sunflower oil.  Legumes, such as peas, beans, and lentils.  Nuts and seeds.  Egg whites. Summary  The body needs cholesterol in small amounts, but  too much cholesterol can cause damage to the arteries and heart.  Most people should eat less than 200 milligrams (mg) of cholesterol a day. This information is not intended to replace advice given to you by your health care provider. Make sure you discuss any questions you have with your health care provider. Document Revised: 04/25/2017 Document Reviewed: 01/07/2017 Elsevier Patient Education  [place].  Tdap (Tetanus, Diphtheria, Pertussis) Vaccine: What You Need to Know 1. Why get vaccinated? Tdap vaccine can prevent tetanus, diphtheria, and pertussis. Diphtheria and pertussis spread from person to person. Tetanus enters the body through cuts or wounds.  TETANUS (T) causes painful stiffening of the muscles. Tetanus can lead to serious health problems, including being unable to open the mouth, having trouble swallowing and breathing, or death.  DIPHTHERIA (D) can lead to difficulty breathing, heart failure, paralysis, or death.  PERTUSSIS (aP), also known as "whooping cough," can cause uncontrollable, violent coughing which makes it hard to breathe, eat, or drink. Pertussis can be extremely serious in babies and young children, causing pneumonia, convulsions, brain damage, or death. In teens and adults, it can cause weight loss, loss of bladder control, passing out, and rib fractures from severe coughing. 2. Tdap vaccine Tdap is only for children 7 years and older, adolescents, and adults.  Adolescents should receive a single dose of Tdap, preferably at age 52 or 83 years. Pregnant women should get a dose of Tdap during every pregnancy, to protect the newborn from pertussis. Infants are most at risk for severe, life-threatening complications from pertussis. Adults who have never received Tdap should get a dose of Tdap. Also, adults should receive a booster dose every 10 years, or earlier in the case of a severe and dirty wound or burn. Booster doses can be either Tdap or Td (a  different vaccine that protects against tetanus and diphtheria but not pertussis). Tdap may be given at the same time as other vaccines. 3. Talk with your health care provider Tell your vaccine provider if the person getting the vaccine:  Has had an allergic reaction after a previous dose of any vaccine that protects against tetanus, diphtheria, or pertussis, or has any severe, life-threatening allergies.  Has had a coma, decreased level of consciousness, or prolonged seizures within 7 days after a previous dose of any pertussis vaccine (DTP, DTaP, or Tdap).  Has seizures or another nervous system problem.  Has ever had Guillain-Barr Syndrome (also called GBS).  Has had severe pain or swelling after a previous dose of any vaccine that protects against tetanus or diphtheria. In some cases, your health care provider may decide to postpone Tdap vaccination to a future visit.  People with minor illnesses, such as a cold, may be vaccinated. People who are moderately or severely ill should usually wait until they recover before getting Tdap vaccine.  Your health care provider can give you more information. 4. Risks of a vaccine reaction  Pain, redness, or swelling where the shot was given, mild fever, headache, feeling tired, and nausea, vomiting, diarrhea, or stomachache sometimes happen after Tdap vaccine. People sometimes faint after medical procedures, including vaccination. Tell your provider if you feel dizzy or have vision changes or ringing in the ears.  As with any medicine, there is a very remote chance of a vaccine causing a severe allergic reaction, other serious injury, or death. 5. What if there is a serious problem? An allergic reaction could occur after the vaccinated person leaves the clinic. If you see signs of a severe allergic reaction (hives, swelling of the face and throat, difficulty breathing, a fast heartbeat, dizziness, or weakness), call 9-1-1 and get the person to the  nearest hospital. For other signs that concern you, call your health care provider.  Adverse reactions should be reported to the Vaccine Adverse Event Reporting System (VAERS). Your health care provider will usually file this report, or you can do it yourself. Visit the VAERS website at www.vaers.SamedayNews.es or call (952) 701-5432. VAERS is only for reporting reactions, and VAERS staff do not give medical advice. 6. The National Vaccine Injury Compensation Program The [employer] Vaccine Injury Compensation Program (VICP) is a federal program that was created to compensate people who may have been injured by certain vaccines. Visit the VICP website at GoldCloset.com.ee or call 646-428-8895 to learn about the program and about filing a claim. There is a time limit to file a claim for compensation. 7. How can I learn more?  Ask your health care provider.  Call your local or state health department.  Contact the Centers for Disease Control and Prevention (CDC): ? Call (440)187-2124 (1-800-CDC-INFO) or ? Visit CDC's website at http://hunter.com/ Vaccine Information Statement Tdap (Tetanus, Diphtheria, Pertussis) Vaccine (08/26/2018) This information is not intended to replace advice given to you by your health care provider. Make sure you discuss any questions you have with your health care provider. Document Revised: 09/04/2018 Document Reviewed: 09/07/2018 Elsevier Patient Education  [place].   Prediabetes Eating Plan Prediabetes is a condition that causes blood sugar (glucose) levels to be higher than normal. This increases the risk for developing diabetes. In order to prevent diabetes from developing, your health care provider may recommend a diet and other lifestyle changes to help you:  Control your blood glucose levels.  Improve your cholesterol levels.  Manage your  blood pressure. Your health care provider may recommend working with a diet and nutrition  specialist (dietitian) to make a meal plan that is best for you. What are tips for following this plan? Lifestyle  Set weight loss goals with the help of your health care team. It is recommended that most people with prediabetes lose 7% of their current body weight.  Exercise for at least 30 minutes at least 5 days a week.  Attend a support group or seek ongoing support from a mental health counselor.  Take over-the-counter and prescription medicines only as told by your health care provider. Reading food labels  Read food labels to check the amount of fat, salt (sodium), and sugar in prepackaged foods. Avoid foods that have: ? Saturated fats. ? Trans fats. ? Added sugars.  Avoid foods that have more than 300 milligrams (mg) of sodium per serving. Limit your daily sodium intake to less than 2,300 mg each day. Shopping  Avoid buying pre-made and processed foods. Cooking  Cook with olive oil. Do not use butter, lard, or ghee.  Bake, broil, grill, or boil foods. Avoid frying. Meal planning   Work with your dietitian to develop an eating plan that is right for you. This may include: ? Tracking how many calories you take in. Use a food diary, notebook, or mobile application to track what you eat at each meal. ? Using the glycemic index (GI) to plan your meals. The index tells you how quickly a food will raise your blood glucose. Choose low-GI foods. These foods take a longer time to raise blood glucose.  Consider following a Mediterranean diet. This diet includes: ? Several servings each day of fresh fruits and vegetables. ? Eating fish at least twice a week. ? Several servings each day of whole grains, beans, nuts, and seeds. ? Using olive oil instead of other fats. ? Moderate alcohol consumption. ? Eating small amounts of red meat and whole-fat dairy.  If you have high blood pressure, you may need to limit your sodium intake or follow a diet such as the DASH eating plan. DASH  is an eating plan that aims to lower high blood pressure. What foods are recommended? The items listed below may not be a complete list. Talk with your dietitian about what dietary choices are best for you. Grains Whole grains, such as whole-wheat or whole-grain breads, crackers, cereals, and pasta. Unsweetened oatmeal. Bulgur. Barley. Quinoa. Brown rice. Corn or whole-wheat flour tortillas or taco shells. Vegetables Lettuce. Spinach. Peas. Beets. Cauliflower. Cabbage. Broccoli. Carrots. Tomatoes. Squash. Eggplant. Herbs. Peppers. Onions. Cucumbers. Brussels sprouts. Fruits Berries. Bananas. Apples. Oranges. Grapes. Papaya. Mango. Pomegranate. Kiwi. Grapefruit. Cherries. Meats and other protein foods Seafood. Poultry without skin. Lean cuts of pork and beef. Tofu. Eggs. Nuts. Beans. Dairy Low-fat or fat-free dairy products, such as yogurt, cottage cheese, and cheese. Beverages Water. Tea. Coffee. Sugar-free or diet soda. Seltzer water. Lowfat or no-fat milk. Milk alternatives, such as soy or almond milk. Fats and oils Olive oil. Canola oil. Sunflower oil. Grapeseed oil. Avocado. Walnuts. Sweets and desserts Sugar-free or low-fat pudding. Sugar-free or low-fat ice cream and other frozen treats. Seasoning and other foods Herbs. Sodium-free spices. Mustard. Relish. Low-fat, low-sugar ketchup. Low-fat, low-sugar barbecue sauce. Low-fat or fat-free mayonnaise. What foods are not recommended? The items listed below may not be a complete list. Talk with your dietitian about what dietary choices are best for you. Grains Refined white flour and flour products, such as bread, pasta, snack foods,  and cereals. Vegetables Canned vegetables. Frozen vegetables with butter or cream sauce. Fruits Fruits canned with syrup. Meats and other protein foods Fatty cuts of meat. Poultry with skin. Breaded or fried meat. Processed meats. Dairy Full-fat yogurt, cheese, or milk. Beverages Sweetened drinks,  such as sweet iced tea and soda. Fats and oils Butter. Lard. Ghee. Sweets and desserts Baked goods, such as cake, cupcakes, pastries, cookies, and cheesecake. Seasoning and other foods Spice mixes with added salt. Ketchup. Barbecue sauce. Mayonnaise. Summary  To prevent diabetes from developing, you may need to make diet and other lifestyle changes to help control blood sugar, improve cholesterol levels, and manage your blood pressure.  Set weight loss goals with the help of your health care team. It is recommended that most people with prediabetes lose 7 percent of their current body weight.  Consider following a Mediterranean diet that includes plenty of fresh fruits and vegetables, whole grains, beans, nuts, seeds, fish, lean meat, low-fat dairy, and healthy oils. This information is not intended to replace advice given to you by your health care provider. Make sure you discuss any questions you have with your health care provider. Document Revised: 09/04/2018 Document Reviewed: 07/17/2016 Elsevier Patient Education  [place].   Bisoprolol Tablets What is this medicine? BISOPROLOL (bis [place] proe lol) is a beta blocker. It decreases the amount of work your heart has to do and helps your heart beat regularly. It is used to treat high blood pressure. This medicine may be used for other purposes; ask your health care provider or pharmacist if you have questions. COMMON BRAND NAME(S): Zebeta What should I tell my health care provider before I take this medicine? They need to know if you have any of these conditions:  chest pain (angina)  diabetes  heart or vessel disease like slow heart rate, worsening heart failure, heart block, sick sinus syndrome or Raynaud's disease  kidney disease  liver disease  lung or breathing disease, like asthma or emphysema  pheochromocytoma  thyroid disease  an unusual or allergic reaction to bisoprolol, other beta-blockers, medicines,  foods, dyes, or preservatives  pregnant or trying to get pregnant  breast-feeding How should I use this medicine? Take this drug by mouth. Take it as directed on the prescription label at the same time every day. You can take it with or without food. If it upsets your stomach, take it with food. Keep taking it unless your health care provider tells you to stop. Talk to your health care provider about the use of this drug in children. Special care may be needed. Overdosage: If you think you have taken too much of this medicine contact a poison control center or emergency room at once. NOTE: This medicine is only for you. Do not share this medicine with others. What if I miss a dose? If you miss a dose, take it as soon as you can. If it is almost time for your next dose, take only that dose. Do not take double or extra doses. What may interact with this medicine? This medicine may interact with the following medications:  certain medicines for blood pressure, heart disease, irregular heart beat  NSAIDs, medicines for pain and inflammation, like ibuprofen or naproxen  rifampin This list may not describe all possible interactions. Give your health care provider a list of all the medicines, herbs, non-prescription drugs, or dietary supplements you use. Also tell them if you smoke, drink alcohol, or use illegal drugs. Some items may interact  with your medicine. What should I watch for while using this medicine? Visit your doctor or health care professional for regular checks on your progress. Check your heart rate and blood pressure regularly while you are taking this medicine. Ask your doctor or health care professional what your heart rate and blood pressure should be, and when you should contact him or her. You may get drowsy or dizzy. Do not drive, use machinery, or do anything that needs mental alertness until you know how this drug affects you. Do not stand or sit up quickly, especially if you  are an older patient. This reduces the risk of dizzy or fainting spells. Alcohol can make you more drowsy and dizzy. Avoid alcoholic drinks. This medicine may increase blood sugar. Ask your healthcare provider if changes in diet or medicines are needed if you have diabetes. Do not treat yourself for coughs, colds, or pain while you are taking this medicine without asking your doctor or health care professional for advice. Some ingredients may increase your blood pressure. What side effects may I notice from receiving this medicine? Side effects that you should report to your doctor or health care professional as soon as possible:  allergic reactions like skin rash, itching or hives, swelling of the face, lips, or tongue  breathing problems  chest pain  cold, tingling, or numb hands or feet  confusion  irregular, slow heartbeat  muscle aches and pains   signs and symptoms of high blood sugar such as being more thirsty or hungry or having to urinate more than normal. You may also feel very tired or have blurry vision.  sweating  swollen legs or ankles  tremors  vomiting Side effects that usually do not require medical attention (report to your doctor or health care professional if they continue or are bothersome):  anxiety  change in sex drive or performance  depression  diarrhea  dry or burning eyes  headache  nausea This list may not describe all possible side effects. Call your doctor for medical advice about side effects. You may report side effects to FDA at 1-800-FDA-1088. Where should I keep my medicine? Keep out of the reach of children and pets. Store at room temperature between 20 and 25 degrees C (68 and 77 degrees F). Protect from light and moisture. Keep the container tightly closed. Throw away any unused drug after the expiration date. NOTE: This sheet is a summary. It may not cover all possible information. If you have questions about this medicine, talk  to your doctor, pharmacist, or health care provider.  2020 Elsevier/Gold Standard (2018-12-24 16:46:40) Metoprolol Extended-Release Capsules What is this medicine? METOPROLOL (me TOE proe lole) is a beta blocker. It decreases the amount of work your heart has to do and helps your heart beat regularly. It treats high blood pressure and/or prevents chest pain (also called angina). It also treats heart failure. This medicine may be used for other purposes; ask your health care provider or pharmacist if you have questions. COMMON BRAND NAME(S): KAPSPARGO What should I tell my health care provider before I take this medicine? They need to know if you have any of these conditions:  diabetes  heart disease  liver disease  lung or breathing disease, like asthma  pheochromocytoma  thyroid disease  an unusual or allergic reaction to metoprolol, other beta-blockers, medicines, foods, dyes, or preservatives  pregnant or trying to get pregnant  breast-feeding How should I use this medicine? Take this drug by mouth with  water. Take it as directed on the prescription label at the same time every day. Do not cut, crush or chew this drug. Swallow the capsules whole. You may open the capsule and put the contents in 1 teaspoon of applesauce. Swallow the drug and applesauce right away. Do not chew the drug or applesauce. Keep taking it unless your health care provider tells you to stop. Talk to your health care provider about the use of this drug in children. While it may be prescribed for children as young as 6 for selected conditions, precautions do apply. Overdosage: If you think you have taken too much of this medicine contact a poison control center or emergency room at once. NOTE: This medicine is only for you. Do not share this medicine with others. What if I miss a dose? If you miss a dose, take it as soon as you can. If it is almost time for your next dose, take only that dose. Do not take  double or extra doses. What may interact with this medicine? This medicine may interact with the following medications:  certain medicines for blood pressure, heart disease, irregular heart beat  epinephrine  fluoxetine  MAOIs like Carbex, Eldepryl, Marplan, Nardil, and Parnate  paroxetine  reserpine This list may not describe all possible interactions. Give your health care provider a list of all the medicines, herbs, non-prescription drugs, or dietary supplements you use. Also tell them if you smoke, drink alcohol, or use illegal drugs. Some items may interact with your medicine. What should I watch for while using this medicine? You may get drowsy or dizzy. Do not drive, use machinery, or do anything that needs mental alertness until you know how this medicine affects you. Do not stand or sit up quickly, especially if you are an older patient. This reduces the risk of dizzy or fainting spells. Alcohol may interfere with the effect of this medicine. Avoid alcoholic drinks. Visit your doctor or health care professional for regular checks on your progress. Check your blood pressure as directed. Ask your doctor or health care professional what your blood pressure should be and when you should contact him or her. Do not treat yourself for coughs, colds, or pain while you are using this medicine without asking your doctor or health care professional for advice. Some ingredients may increase your blood pressure. This medicine may increase blood sugar. Ask your healthcare provider if changes in diet or medicines are needed if you have diabetes. What side effects may I notice from receiving this medicine? Side effects that you should report to your doctor or health care professional as soon as possible:  allergic reactions like skin rash, itching or hives, swelling of the face, lips, or tongue  cold hands or feet  signs and symptoms of high blood sugar such as being more thirsty or hungry or  having to urinate more than normal. You may also feel very tired or have blurry vision.  signs and symptoms of low blood pressure like dizziness; feeling faint or lightheaded, falls; unusually weak or tired  signs of worsening heart failure like breathing problems, swelling in your legs and feet  suicidal thoughts or other mood changes  unusually slow heartbeat Side effects that usually do not require medical attention (report these to your doctor or health care professional if they continue or are bothersome):  anxious  change in sex drive or performance  diarrhea  headache  trouble sleeping  upset stomach This list may not describe all  possible side effects. Call your doctor for medical advice about side effects. You may report side effects to FDA at 1-800-FDA-1088. Where should I keep my medicine? Keep out of the reach of children and pets. Store at room temperature between 20 and 25 degrees C (68 and 77 degrees F). Throw away any unused drug after the expiration date. NOTE: This sheet is a summary. It may not cover all possible information. If you have questions about this medicine, talk to your doctor, pharmacist, or health care provider.  2020 Elsevier/Gold Standard (2019-02-10 13:24:03)

## 2019-12-21 ENCOUNTER — Other Ambulatory Visit: Payer: Managed Care, Other (non HMO)

## 2019-12-29 IMAGING — US ULTRASOUND ABDOMEN COMPLETE
2 series · 13 of 25 positions shown · non-contrast
Comparison: Abdomen and pelvis CT report dated 06/03/2012.

CLINICAL DATA: Elevated liver function tests.

EXAM:
ABDOMEN ULTRASOUND COMPLETE

[Series 1: ultrasound abdomen complete · 0.20mm/px · 12 of 105 slices shown (1 of 2)]
[im 1/105]
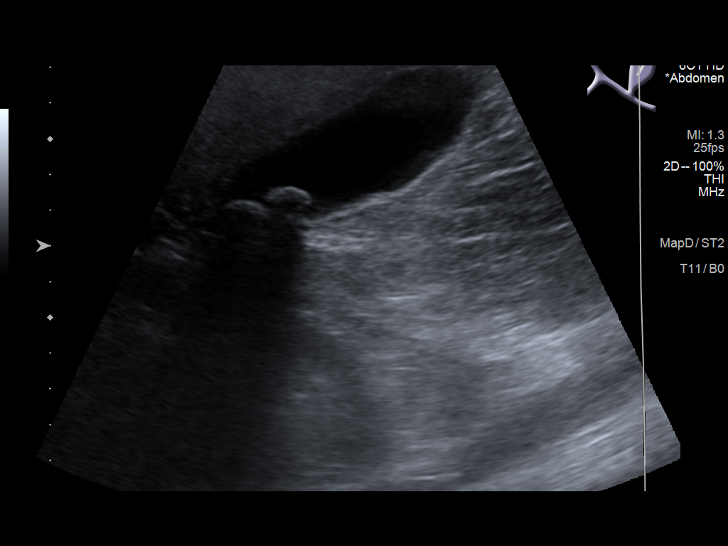
[im 10/105]
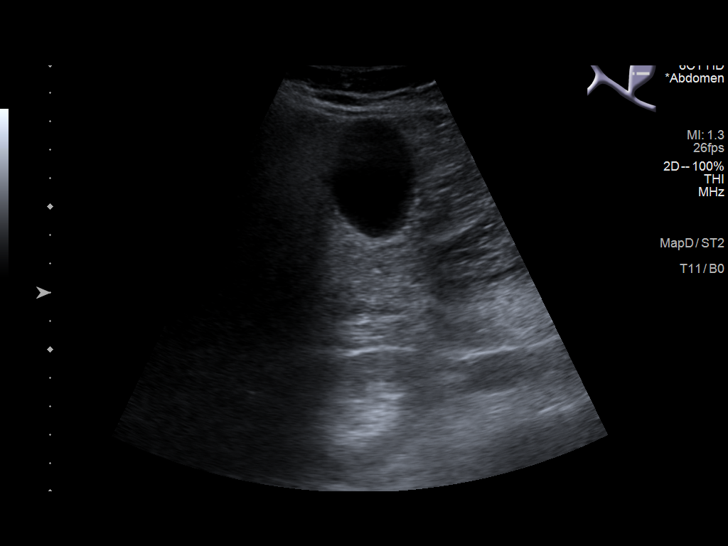
[im 19/105]
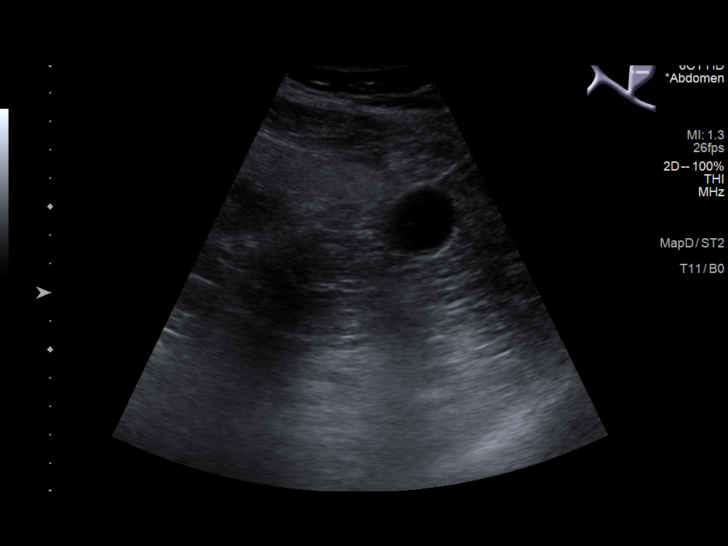
[im 28/105]
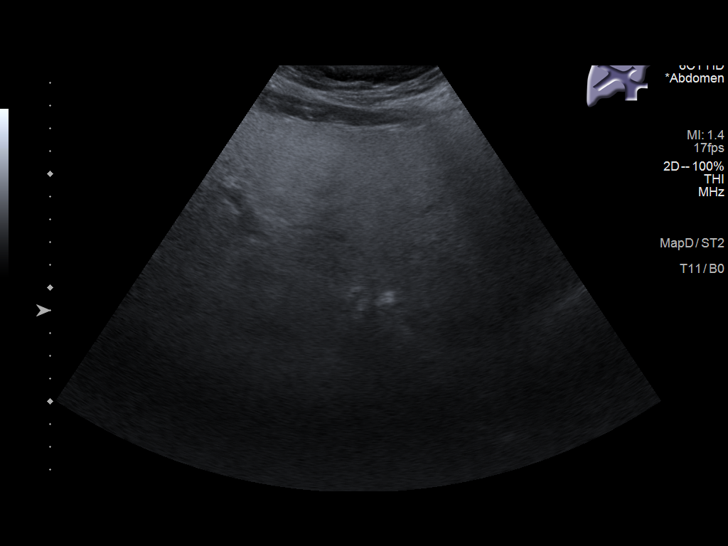
[im 37/105]
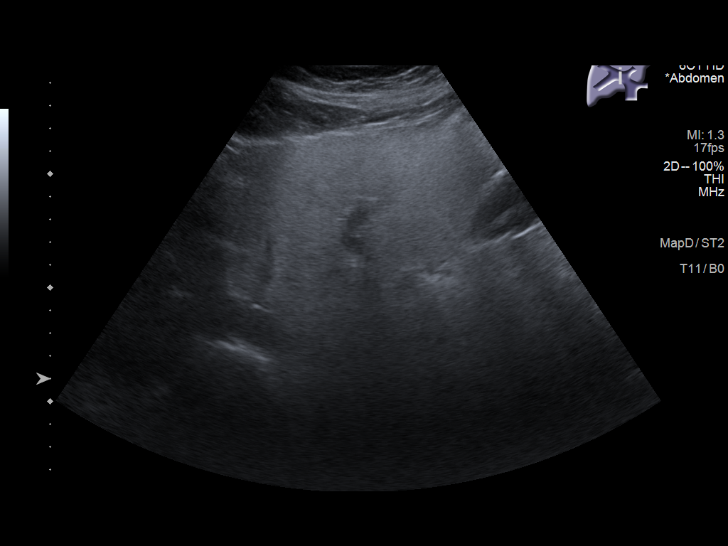
[im 46/105]
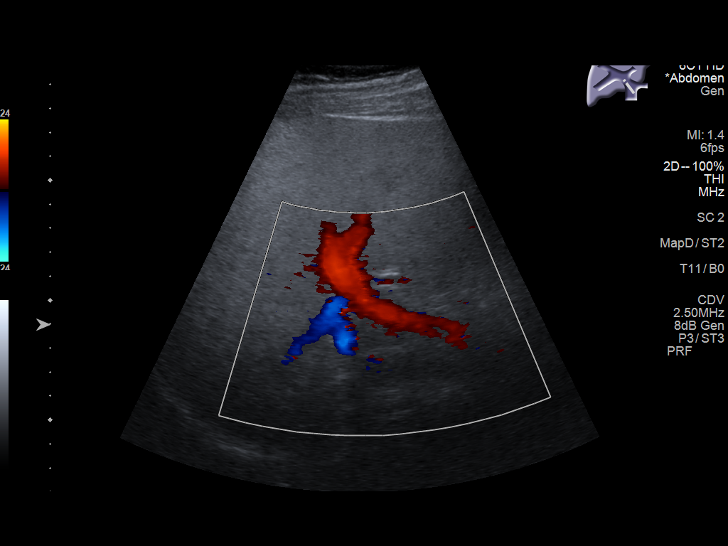
[im 55/105]
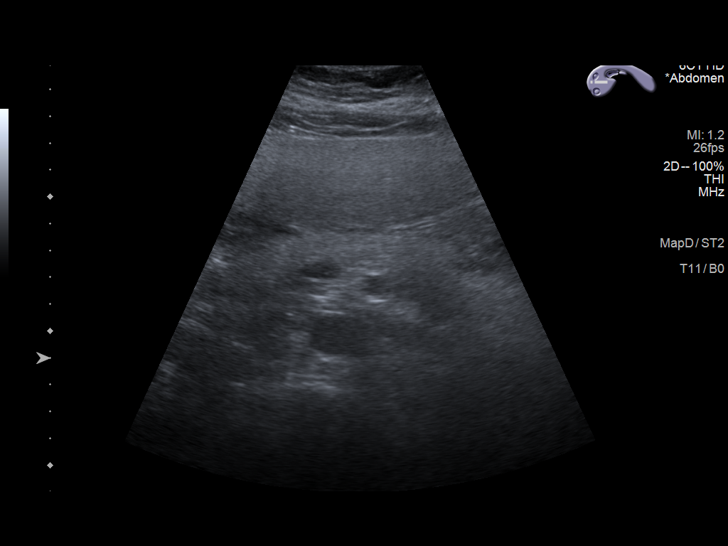
[im 64/105]
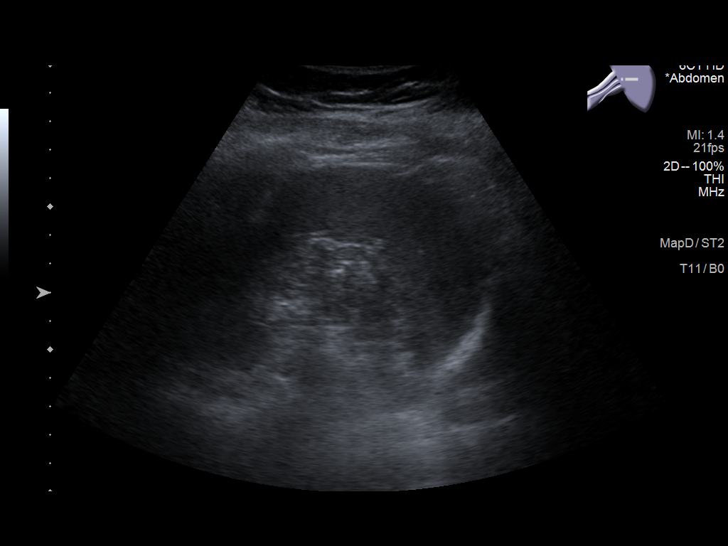
[im 73/105]
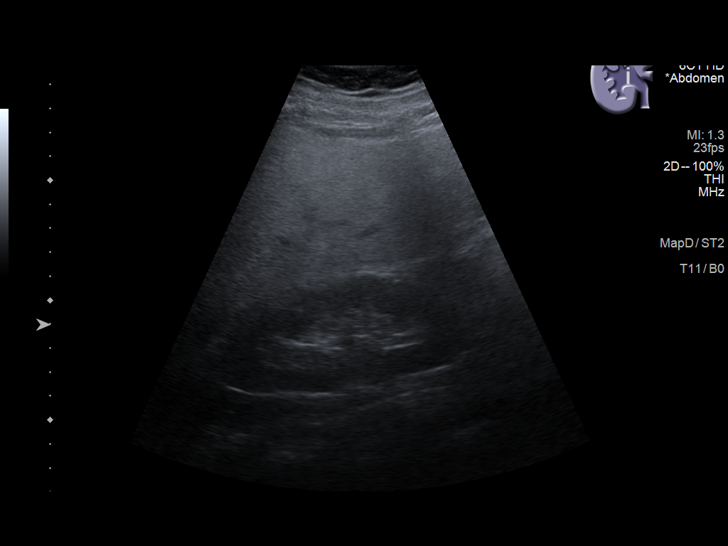
[im 82/105]
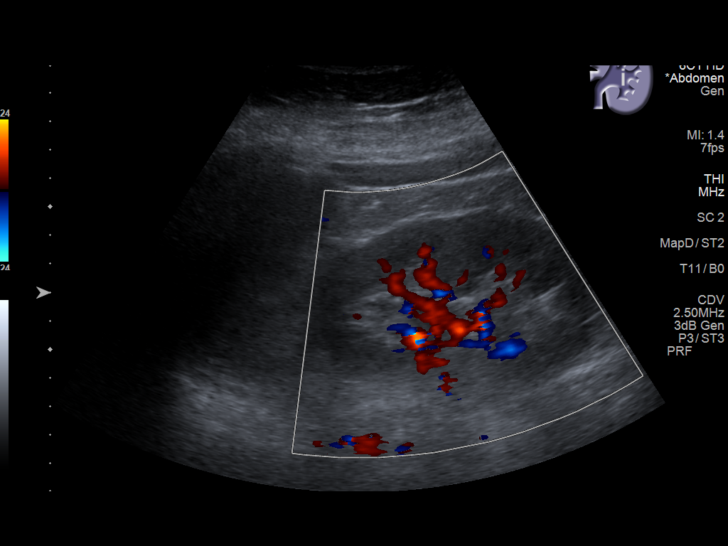
[im 91/105]
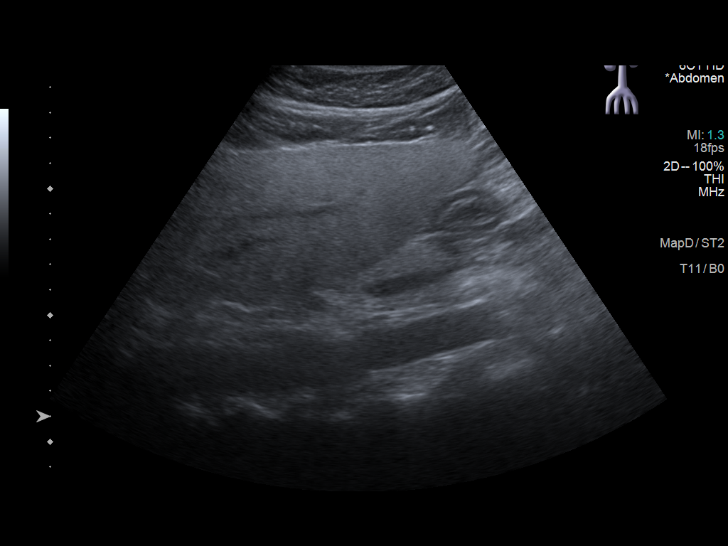
[im 100/105]
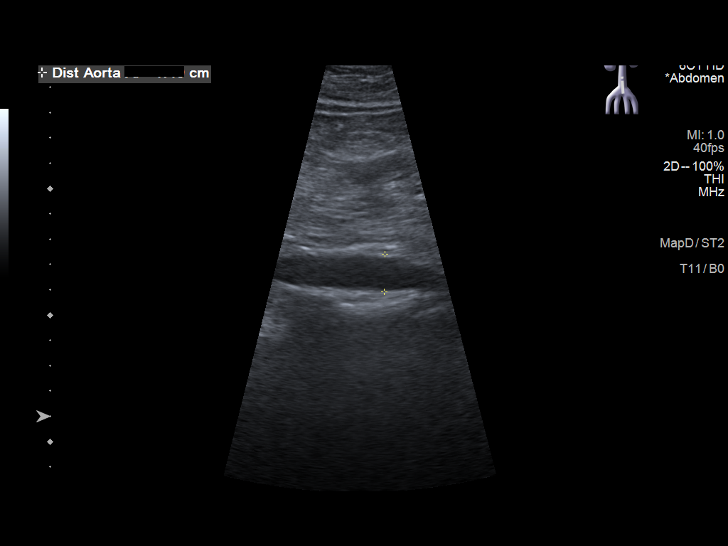

[Series 2001: ultrasound abdomen complete · 0.30mm/px · 1 of 1 slices shown (2 of 2)]
[im 1/1]
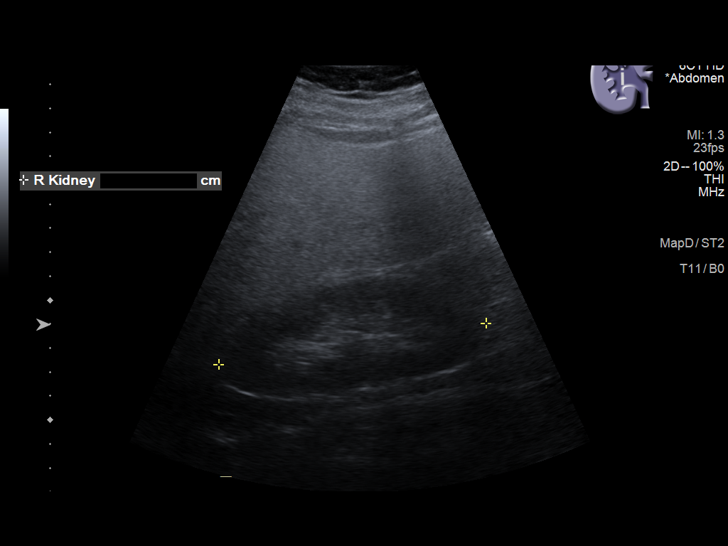

[13 of 25 positions shown; findings below may reference images not displayed]

FINDINGS: Gallbladder: At least 2 gallstones in the gallbladder, the largest
measuring 1.4 cm. No gallbladder wall thickening or pericholecystic
fluid. No sonographic Murphy sign.

Common bile duct: Diameter: 2.2 mm.

Liver: Diffusely echogenic. No mass seen. Portal vein is patent on
color Doppler imaging with normal direction of blood flow towards
the liver.

IVC: No abnormality visualized.

Pancreas: Visualized portion unremarkable.

Spleen: Size and appearance within normal limits.

Right Kidney: Length: 11.3 cm. Echogenicity within normal limits. No
mass or hydronephrosis visualized.

Left Kidney: Length: 10.7 cm. Echogenicity within normal limits. No
mass or hydronephrosis visualized.

Abdominal aorta: No aneurysm visualized.

Other findings: None.
IMPRESSION: 1. Diffusely echogenic liver. This is most commonly due to
steatosis. Chronic hepatitis and cirrhosis can also produce this
appearance.
2. Otherwise, normal examination.

## 2020-04-17 ENCOUNTER — Ambulatory Visit (INDEPENDENT_AMBULATORY_CARE_PROVIDER_SITE_OTHER): Payer: Managed Care, Other (non HMO) | Admitting: Dermatology

## 2020-04-17 ENCOUNTER — Other Ambulatory Visit: Payer: Self-pay

## 2020-04-17 DIAGNOSIS — Z1283 Encounter for screening for malignant neoplasm of skin: Secondary | ICD-10-CM

## 2020-04-17 DIAGNOSIS — L80 Vitiligo: Secondary | ICD-10-CM

## 2020-04-17 DIAGNOSIS — L814 Other melanin hyperpigmentation: Secondary | ICD-10-CM

## 2020-04-17 DIAGNOSIS — L578 Other skin changes due to chronic exposure to nonionizing radiation: Secondary | ICD-10-CM

## 2020-04-17 DIAGNOSIS — D229 Melanocytic nevi, unspecified: Secondary | ICD-10-CM

## 2020-04-17 DIAGNOSIS — L821 Other seborrheic keratosis: Secondary | ICD-10-CM

## 2020-04-17 DIAGNOSIS — L918 Other hypertrophic disorders of the skin: Secondary | ICD-10-CM | POA: Diagnosis not present

## 2020-04-17 DIAGNOSIS — L819 Disorder of pigmentation, unspecified: Secondary | ICD-10-CM

## 2020-04-17 DIAGNOSIS — D1800 Hemangioma unspecified site: Secondary | ICD-10-CM

## 2020-04-17 NOTE — Progress Notes (Signed)
Follow-Up Visit   Subjective  Morgan Gonzalez is a 54 y.o. female who presents for the following: TBSE. She has history of vitiligo and is currently not treating it. Patient here for full body skin exam and skin cancer screening. No history of skin cancer, nothing new or changing patient is aware of.  The following portions of the chart were reviewed this encounter and updated as appropriate:  Tobacco  Allergies  Meds  Problems  Med Hx  Surg Hx  Fam Hx     Review of Systems:  No other skin or systemic complaints except as noted in HPI or Assessment and Plan.  Objective  Well appearing patient in no apparent distress; mood and affect are within normal limits.  A full examination was performed including scalp, head, eyes, ears, nose, lips, neck, chest, axillae, abdomen, back, buttocks, bilateral upper extremities, bilateral lower extremities, hands, feet, fingers, toes, fingernails, and toenails. All findings within normal limits unless otherwise noted below.  Objective  mandible, pretibial: Depigmented patches   Objective  Low Back:    Assessment & Plan  Vitiligo -chronic and persistent mandible, pretibial Reviewed chronic nature, no cure and can be difficult to treat.  Vitiligo is an autoimmune condition which causes loss of skin pigment and is commonly seen on the face and may also involve areas of trauma like hands, elbows, knees, and ankles.  Treatments include topical steroids and other topical anti-inflammatory ointments/creams.  Sometimes narrow band UV light therapy or Xtrac laser is helpful, both of which require twice weekly treatments for at least 3-6 months.  Antioxidant vitamins, such as Vitamins C&E, and alpha lipoic acid may be added to enhance treatment. Patient declines treatment at this time. Benign-appearing.  Observation.  Call clinic for new or changing lesions.  Recommend daily use of broad spectrum spf 30+ sunscreen to sun-exposed areas.    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 - Observe - Call for any changes  Melanocytic Nevi - Tan-brown and/or pink-flesh-colored symmetric macules and papules, postauricular - Benign appearing on exam today - Observation - Call clinic for new or changing moles - Recommend daily use of broad spectrum spf 30+ sunscreen to sun-exposed areas.   Hemangiomas - Red papules - Discussed benign nature - Observe - Call for any changes  Actinic Damage - Chronic, secondary to cumulative UV/sun exposure - diffuse scaly erythematous macules with underlying dyspigmentation - Recommend daily broad spectrum sunscreen SPF 30+ to sun-exposed areas, reapply every 2 hours as needed.  - Call for new or changing lesions.  Skin cancer screening performed today.  Acrochordons (Skin Tags) - Removal desired by patient - Fleshy, skin-colored pedunculated papules at neck - Benign appearing.  - Patient desires removal. Reviewed that this is not covered by insurance and they will be charged a cosmetic fee for removal. Patient signed non-covered consent.  - Prior to procedure, discussed risks of blister formation, small wound, skin dyspigmentation, or rare scar following cryotherapy.  PROCEDURE - Cryotherapy was performed to affected areas. The procedure was tolerated well. Wound care was reviewed with the patient. They were advised to call with any concerns. Total number of treated acrochordons 6.  Acrochordons (Skin Tags) - Removal desired by patient - Fleshy, skin-colored pedunculated papules at neck - Benign  appearing.  - Patient desires removal. Reviewed that this is not covered by insurance and they will be charged a cosmetic fee for  removal. Patient signed non-covered consent.  - Prior to the procedure, reviewed the expected small wound. Also reviewed the risk of leaving a small scar and the small risk of infection.  PROCEDURE - The areas were prepped with isopropyl alcohol. A small amount of lidocaine 1% with epinephrine was injected at the base of each lesion to achieve good local anesthesia. The skin tags were removed using a snip technique. Aluminum chloride was used for hemostasis. Petrolatum and a bandage were applied. The procedure was tolerated well. - Wound care was reviewed with the patient. They were advised to call with any concerns. Total number of treated acrochordons 6.  Return in about 1 year (around 04/17/2021) for TBSE.  Graciella Belton, RMA, am acting as scribe for Sarina Ser, MD . Documentation: I have reviewed the above documentation for accuracy and completeness, and I agree with the above.  Sarina Ser, MD

## 2020-04-17 NOTE — Patient Instructions (Addendum)
Melanoma ABCDEs  Melanoma is the most dangerous type of skin cancer, and is the leading cause of death from skin disease.  You are more likely to develop melanoma if you:  Have light-colored skin, light-colored eyes, or red or blond hair  Spend a lot of time in the sun  Tan regularly, either outdoors or in a tanning bed  Have had blistering sunburns, especially during childhood  Have a close family member who has had a melanoma  Have atypical moles or large birthmarks  Early detection of melanoma is key since treatment is typically straightforward and cure rates are extremely high if we catch it early.   The first sign of melanoma is often a change in a mole or a new dark spot.  The ABCDE system is a way of remembering the signs of melanoma.  A for asymmetry:  The two halves do not match. B for border:  The edges of the growth are irregular. C for color:  A mixture of colors are present instead of an even brown color. D for diameter:  Melanomas are usually (but not always) greater than 67mm - the size of a pencil eraser. E for evolution:  The spot keeps changing in size, shape, and color.  Please check your skin once per month between visits. You can use a small mirror in front and a large mirror behind you to keep an eye on the back side or your body.   If you see any new or changing lesions before your next follow-up, please call to schedule a visit.  Please continue daily skin protection including broad spectrum sunscreen SPF 30+ to sun-exposed areas, reapplying every 2 hours as needed when you're outdoors.   AmLactin Rapid Relief

## 2020-04-25 ENCOUNTER — Encounter: Payer: Self-pay | Admitting: Dermatology

## 2020-05-16 ENCOUNTER — Encounter: Payer: Self-pay | Admitting: Internal Medicine

## 2020-05-16 ENCOUNTER — Ambulatory Visit (INDEPENDENT_AMBULATORY_CARE_PROVIDER_SITE_OTHER): Payer: Managed Care, Other (non HMO) | Admitting: Internal Medicine

## 2020-05-16 ENCOUNTER — Other Ambulatory Visit: Payer: Self-pay

## 2020-05-16 VITALS — BP 150/100 | HR 101 | Temp 98.4°F | Ht 63.0 in | Wt 186.5 lb

## 2020-05-16 DIAGNOSIS — E2839 Other primary ovarian failure: Secondary | ICD-10-CM

## 2020-05-16 DIAGNOSIS — E538 Deficiency of other specified B group vitamins: Secondary | ICD-10-CM

## 2020-05-16 DIAGNOSIS — Z1389 Encounter for screening for other disorder: Secondary | ICD-10-CM

## 2020-05-16 DIAGNOSIS — Z1329 Encounter for screening for other suspected endocrine disorder: Secondary | ICD-10-CM

## 2020-05-16 DIAGNOSIS — R7303 Prediabetes: Secondary | ICD-10-CM

## 2020-05-16 DIAGNOSIS — I10 Essential (primary) hypertension: Secondary | ICD-10-CM

## 2020-05-16 DIAGNOSIS — Z1211 Encounter for screening for malignant neoplasm of colon: Secondary | ICD-10-CM

## 2020-05-16 DIAGNOSIS — E559 Vitamin D deficiency, unspecified: Secondary | ICD-10-CM

## 2020-05-16 DIAGNOSIS — R5383 Other fatigue: Secondary | ICD-10-CM

## 2020-05-16 MED ORDER — LOSARTAN POTASSIUM-HCTZ 50-12.5 MG PO TABS: 0.5000 | ORAL_TABLET | Freq: Every day | ORAL | 3 refills | Status: DC

## 2020-05-16 NOTE — Progress Notes (Signed)
Chief Complaint  Patient presents with  . Follow-up  . Hypertension   F/u  1. htn uncontrolled on norvasc 2.5 mg qd not had today but home BP 140s-150s/90s-100s FH HTN she does snore disc consider home sleep study stress at home 3 kids 2 boys and 1 girl and foreign exchange study from [place]. She is drinking 2-4 cups of coffee daily caffienated  rec labs pt had labs ordered 10/2019 not done yet   2. Wants dexa agreeable colonoscopy instead of cologuard today   3. C/o fatigue   Review of Systems  Constitutional: Negative for weight loss.  HENT: Negative for hearing loss.   Eyes: Negative for blurred vision.  Respiratory: Negative for shortness of breath.   Cardiovascular: Negative for chest pain.  Gastrointestinal: Negative for abdominal pain.  Neurological: Negative for headaches.  Psychiatric/Behavioral: Negative for depression.   Past Medical History:  Diagnosis Date  . Allergic rhinitis   . Hypertension   . Vitamin D deficiency    Past Surgical History:  Procedure Laterality Date  . CESAREAN SECTION     3  . JOINT REPLACEMENT     hip raplacement (right)   Family History  Problem Relation Age of Onset  . Stroke Mother   . Hypertension Mother   . Diabetes Mother   . Hyperlipidemia Mother   . CAD Mother        with stents x 2  . Hypertension Father   . Hyperlipidemia Father   . Brain cancer Father   . Sleep apnea Brother    Social History   Socioeconomic History  . Marital status: Married    Spouse name: Not on file  . Number of children: Not on file  . Years of education: Not on file  . Highest education level: Not on file  Occupational History  . Not on file  Tobacco Use  . Smoking status: Never Smoker  . Smokeless tobacco: Never Used  Substance and Sexual Activity  . Alcohol use: Yes    Alcohol/week: 1.0 standard drink    Types: 1 Standard drinks or equivalent per week  . Drug use: No  . Sexual activity: Not Currently    Partners: Male  Other  Topics Concern  . Not on file  Social History Narrative   3 kids    Married to husband 59 dating since 70 college   Works with school    Husband [employer]    Social Determinants of Health   Financial Resource Strain: Not on [employer] Insecurity: Not on file  Transportation Needs: Not on file  Physical Activity: Not on file  Stress: Not on file  Social Connections: Not on file  Intimate Partner Violence: Not on file   Current Meds  Medication Sig  . amLODipine (NORVASC) 2.5 MG tablet Take 1 tablet (2.5 mg total) by mouth daily.   No Known Allergies No results found for this or any previous visit (from the past 2160 hour(s)). Objective  Body mass index is 33.04 kg/m. Wt Readings from Last 3 Encounters:  05/16/20 186 lb 8 oz (84.6 kg)  11/12/19 183 lb 6.4 oz (83.2 kg)  12/10/18 182 lb (82.6 kg)   Temp Readings from Last 3 Encounters:  05/16/20 98.4 F (36.9 C) (Oral)  11/12/19 (!) 97.4 F (36.3 C) (Temporal)  09/15/15 98.5 F (36.9 C) (Oral)   BP Readings from Last 3 Encounters:  05/16/20 (!) 150/100  11/12/19 130/84  09/15/15 136/88   Pulse Readings from  Last 3 Encounters:  05/16/20 (!) 101  11/12/19 92  09/15/15 91    Physical Exam Vitals and nursing note reviewed.  Constitutional:      Appearance: Normal appearance. She is well-developed and well-groomed. She is obese.  HENT:     Head: Normocephalic and atraumatic.  Eyes:     Conjunctiva/sclera: Conjunctivae normal.     Pupils: Pupils are equal, round, and reactive to light.  Cardiovascular:     Rate and Rhythm: Normal rate and regular rhythm.     Heart sounds: Normal heart sounds. No murmur heard.   Pulmonary:     Effort: Pulmonary effort is normal.     Breath sounds: Normal breath sounds.  Skin:    General: Skin is warm and dry.  Neurological:     General: No focal deficit present.     Mental Status: She is alert and oriented to person, place, and time. Mental status is at baseline.      Gait: Gait normal.  Psychiatric:        Attention and Perception: Attention and perception normal.        Mood and Affect: Mood normal.        Speech: Speech normal.        Behavior: Behavior normal. Behavior is cooperative.        Thought Content: Thought content normal.        Cognition and Memory: Cognition and memory normal.        Judgment: Judgment normal.     Assessment  Plan  Hypertension, unspecified type - Plan: losartan-hydrochlorothiazide (HYZAAR) 50-12.5 MG tablet 1/2 tablet qd in am and norvasc 2.5 mg qd   fatigue B12 deficiency - Plan: Vitamin B12 with comp labs Consider home sleep study in future   HM Fasting labs asap ordered back in 10/2019 Declines flu shot  covid 2/2 moderna consider booster  Consider shingrix   Tdap 11/12/19  Pap nl 03/08/19 Dr. Garwin Brothers Mammogram -she reports gets q 2 years with ob/gyn seeing since 1995  -03/07/21   Ordered dexa FH osteoporosis  Colonoscopy vs cologuard disc today again referred to [facility]  rec healthy diet and exercise   On vitamin D 2000 IU qd  Provider: Dr. Olivia Mackie McLean-Scocuzza-[facility]

## 2020-05-16 NOTE — Patient Instructions (Addendum)
[facility] [phone] Dr. Deatra [address]   CPT code [phone] cologuard   Consider shingrix vaccine  Consider moderna booster   Zoster Vaccine, Recombinant injection What is this medicine? ZOSTER VACCINE (ZOS ter vak SEEN) is used to prevent shingles in adults 52 years old and over. This vaccine is not used to treat shingles or nerve pain from shingles. This medicine may be used for other purposes; ask your health care provider or pharmacist if you have questions. COMMON BRAND NAME(S): [employer] What should I tell my health care provider before I take this medicine? They need to know if you have any of these conditions:  blood disorders or disease  cancer like leukemia or lymphoma  immune system problems or therapy  an unusual or allergic reaction to vaccines, other medications, foods, dyes, or preservatives  pregnant or trying to get pregnant  breast-feeding How should I use this medicine? This vaccine is for injection in a muscle. It is given by a health care professional. Talk to your pediatrician regarding the use of this medicine in children. This medicine is not approved for use in children. Overdosage: If you think you have taken too much of this medicine contact a poison control center or emergency room at once. NOTE: This medicine is only for you. Do not share this medicine with others. What if I miss a dose? Keep appointments for follow-up (booster) doses as directed. It is important not to miss your dose. Call your doctor or health care professional if you are unable to keep an appointment. What may interact with this medicine?  medicines that suppress your immune system  medicines to treat cancer  steroid medicines like prednisone or cortisone This list may not describe all possible interactions. Give your health care provider a list of all the medicines, herbs, non-prescription drugs, or dietary supplements you use. Also tell them if you smoke,  drink alcohol, or use illegal drugs. Some items may interact with your medicine. What should I watch for while using this medicine? Visit your doctor for regular check ups. This vaccine, like all vaccines, may not fully protect everyone. What side effects may I notice from receiving this medicine? Side effects that you should report to your doctor or health care professional as soon as possible:  allergic reactions like skin rash, itching or hives, swelling of the face, lips, or tongue  breathing problems Side effects that usually do not require medical attention (report these to your doctor or health care professional if they continue or are bothersome):  chills  headache  fever  nausea, vomiting  redness, warmth, pain, swelling or itching at site where injected  tiredness This list may not describe all possible side effects. Call your doctor for medical advice about side effects. You may report side effects to FDA at 1-800-FDA-1088. Where should I keep my medicine? This vaccine is only given in a clinic, pharmacy, doctor's office, or other health care setting and will not be stored at home. NOTE: This sheet is a summary. It may not cover all possible information. If you have questions about this medicine, talk to your doctor, pharmacist, or health care provider.  2020 Elsevier/Gold Standard (2016-12-23 13:20:30)  Hydrochlorothiazide, HCTZ; Losartan Tablets What is this medicine? LOSARTAN; HYDROCHLOROTHIAZIDE (loe SAR tan; hye droe klor oh THYE a zide) is a combination of a diuretic and an angiotensin II receptor blocker. It treats high blood pressure. It may also be used to lower the risk of stroke. This  medicine may be used for other purposes; ask your health care provider or pharmacist if you have questions. COMMON BRAND NAME(S): Hyzaar What should I tell my health care provider before I take this medicine? They need to know if you have any of these conditions:  decreased  urine  diabetes  kidney disease  liver disease  if you are on a special diet, like a low-salt diet  immune system problems, like lupus  an unusual or allergic reaction to losartan, hydrochlorothiazide, sulfa drugs, other medicines, foods, dyes, or preservatives  pregnant or trying to get pregnant  breast-feeding How should I use this medicine? Take this drug by mouth. Take it as directed on the prescription label at the same time every day. You can take it with or without food. If it upsets your stomach, take it with food. Keep taking it unless your health care provider tells you to stop. Talk to your health care provider about the use of this drug in children. Special care may be needed. Overdosage: If you think you have taken too much of this medicine contact a poison control center or emergency room at once. NOTE: This medicine is only for you. Do not share this medicine with others. What if I miss a dose? If you miss a dose, take it as soon as you can. If it is almost time for your next dose, take only that dose. Do not take double or extra doses. What may interact with this medicine?  barbiturates, like phenobarbital  blood pressure medicines  celecoxib  cimetidine  corticosteroids  diabetic medicines  diuretics, especially triamterene, spironolactone or amiloride  fluconazole  lithium  NSAIDs, medicines for pain and inflammation, like ibuprofen or naproxen  potassium salts or potassium supplements  prescription pain medicines  rifampin  skeletal muscle relaxants like tubocurarine  some cholesterol-lowering medicines like cholestyramine or colestipol This list may not describe all possible interactions. Give your health care provider a list of all the medicines, herbs, non-prescription drugs, or dietary supplements you use. Also tell them if you smoke, drink alcohol, or use illegal drugs. Some items may interact with your medicine. What should I watch for  while using this medicine? Check your blood pressure regularly while you are taking this medicine. Ask your doctor or health care professional what your blood pressure should be and when you should contact him or her. When you check your blood pressure, write down the measurements to show your doctor or health care professional. If you are taking this medicine for a long time, you must visit your health care professional for regular checks on your progress. Make sure you schedule appointments on a regular basis. You must not get dehydrated. Ask your doctor or health care professional how much fluid you need to drink a day. Check with him or her if you get an attack of severe diarrhea, nausea and vomiting, or if you sweat a lot. The loss of too much body fluid can make it dangerous for you to take this medicine. Women should inform their doctor if they wish to become pregnant or think they might be pregnant. There is a potential for serious side effects to an unborn child, particularly in the second or third trimester. Talk to your health care professional or pharmacist for more information. You may get drowsy or dizzy. Do not drive, use machinery, or do anything that needs mental alertness until you know how this drug affects you. Do not stand or sit up quickly, especially if  you are an older patient. This reduces the risk of dizzy or fainting spells. Alcohol can make you more drowsy and dizzy. Avoid alcoholic drinks. This medicine may increase blood sugar. Ask your healthcare provider if changes in diet or medicines are needed if you have diabetes. Talk to your health care professional about your risk of skin cancer. You may be more at risk for skin cancer if you take this medicine. This medicine can make you more sensitive to the sun. Keep out of the sun. If you cannot avoid being in the sun, wear protective clothing and use sunscreen. Do not use sun lamps or tanning beds/booths. Avoid salt substitutes  unless you are told otherwise by your doctor or health care professional. Do not treat yourself for coughs, colds, or pain while you are taking this medicine without asking your doctor or health care professional for advice. Some ingredients may increase your blood pressure. What side effects may I notice from receiving this medicine? Side effects that you should report to your doctor or health care professional as soon as possible:  allergic reactions like skin rash, itching or hives, swelling of the face, lips, or tongue  breathing problems  changes in vision  dark urine  eye pain  fast or irregular heart beat, palpitations, or chest pain  feeling faint or lightheaded  muscle cramps  persistent dry cough  redness, blistering, peeling or loosening of the skin, including inside the mouth   signs and symptoms of high blood sugar such as being more thirsty or hungry or having to urinate more than normal. You may also feel very tired or have blurry vision.  stomach pain  trouble passing urine  unusual bleeding or bruising  worsened gout pain  yellowing of the eyes or skin Side effects that usually do not require medical attention (report to your doctor or health care professional if they continue or are bothersome):  change in sex drive or performance  headache This list may not describe all possible side effects. Call your doctor for medical advice about side effects. You may report side effects to FDA at 1-800-FDA-1088. Where should I keep my medicine? Keep out of the reach of children and pets. Store at room temperature between 15 and 30 degrees C (59 and 86 degrees F). Protect from light. Keep the container tightly closed. Throw away any unused drug after the expiration date. NOTE: This sheet is a summary. It may not cover all possible information. If you have questions about this medicine, talk to your doctor, pharmacist, or health care provider.  2020 Elsevier/Gold  Standard (2019-01-18 12:58:40)  Hypertension, Adult High blood pressure (hypertension) is when the force of blood pumping through the arteries is too strong. The arteries are the blood vessels that carry blood from the heart throughout the body. Hypertension forces the heart to work harder to pump blood and may cause arteries to become narrow or stiff. Untreated or uncontrolled hypertension can cause a heart attack, heart failure, a stroke, kidney disease, and other problems. A blood pressure reading consists of a higher number over a lower number. Ideally, your blood pressure should be below 120/80. The first ("top") number is called the systolic pressure. It is a measure of the pressure in your arteries as your heart beats. The second ("bottom") number is called the diastolic pressure. It is a measure of the pressure in your arteries as the heart relaxes. What are the causes? The exact cause of this condition is not known. There  are some conditions that result in or are related to high blood pressure. What increases the risk? Some risk factors for high blood pressure are under your control. The following factors may make you more likely to develop this condition:  Smoking.  Having type 2 diabetes mellitus, high cholesterol, or both.  Not getting enough exercise or physical activity.  Being overweight.  Having too much fat, sugar, calories, or salt (sodium) in your diet.  Drinking too much alcohol. Some risk factors for high blood pressure may be difficult or impossible to change. Some of these factors include:  Having chronic kidney disease.  Having a family history of high blood pressure.  Age. Risk increases with age.  Race. You may be at higher risk if you are African American.  Gender. Men are at higher risk than women before age 15. After age 28, women are at higher risk than men.  Having obstructive sleep apnea.  Stress. What are the signs or symptoms? High blood pressure  may not cause symptoms. Very high blood pressure (hypertensive crisis) may cause:  Headache.  Anxiety.  Shortness of breath.  Nosebleed.  Nausea and vomiting.  Vision changes.  Severe chest pain.  Seizures. How is this diagnosed? This condition is diagnosed by measuring your blood pressure while you are seated, with your arm resting on a flat surface, your legs uncrossed, and your feet flat on the floor. The cuff of the blood pressure monitor will be placed directly against the skin of your upper arm at the level of your heart. It should be measured at least twice using the same arm. Certain conditions can cause a difference in blood pressure between your right and left arms. Certain factors can cause blood pressure readings to be lower or higher than normal for a short period of time:  When your blood pressure is higher when you are in a health care provider's office than when you are at home, this is called white coat hypertension. Most people with this condition do not need medicines.  When your blood pressure is higher at home than when you are in a health care provider's office, this is called masked hypertension. Most people with this condition may need medicines to control blood pressure. If you have a high blood pressure reading during one visit or you have normal blood pressure with other risk factors, you may be asked to:  Return on a different day to have your blood pressure checked again.  Monitor your blood pressure at home for 1 week or longer. If you are diagnosed with hypertension, you may have other blood or imaging tests to help your health care provider understand your overall risk for other conditions. How is this treated? This condition is treated by making healthy lifestyle changes, such as eating healthy foods, exercising more, and reducing your alcohol intake. Your health care provider may prescribe medicine if lifestyle changes are not enough to get your blood  pressure under control, and if:  Your systolic blood pressure is above 130.  Your diastolic blood pressure is above 80. Your personal target blood pressure may vary depending on your medical conditions, your age, and other factors. Follow these instructions at home: Eating and drinking   Eat a diet that is high in fiber and potassium, and low in sodium, added sugar, and fat. An example eating plan is called the DASH (Dietary Approaches to Stop Hypertension) diet. To eat this way: ? Eat plenty of fresh fruits and vegetables. Try to  fill one half of your plate at each meal with fruits and vegetables. ? Eat whole grains, such as whole-wheat pasta, brown rice, or whole-grain bread. Fill about one fourth of your plate with whole grains. ? Eat or drink low-fat dairy products, such as skim milk or low-fat yogurt. ? Avoid fatty cuts of meat, processed or cured meats, and poultry with skin. Fill about one fourth of your plate with lean proteins, such as fish, chicken without skin, beans, eggs, or tofu. ? Avoid pre-made and processed foods. These tend to be higher in sodium, added sugar, and fat.  Reduce your daily sodium intake. Most people with hypertension should eat less than 1,500 mg of sodium a day.  Do not drink alcohol if: ? Your health care provider tells you not to drink. ? You are pregnant, may be pregnant, or are planning to become pregnant.  If you drink alcohol: ? Limit how much you use to:  0-1 drink a day for women.  0-2 drinks a day for men. ? Be aware of how much alcohol is in your drink. In the [place] one drink equals one 12 oz bottle of beer (355 mL), one 5 oz glass of wine (148 mL), or one 1 oz glass of hard liquor (44 mL). Lifestyle   Work with your health care provider to maintain a healthy body weight or to lose weight. Ask what an ideal weight is for you.  Get at least 30 minutes of exercise most days of the week. Activities may include walking, swimming, or  biking.  Include exercise to strengthen your muscles (resistance exercise), such as Pilates or lifting weights, as part of your weekly exercise routine. Try to do these types of exercises for 30 minutes at least 3 days a week.  Do not use any products that contain nicotine or tobacco, such as cigarettes, e-cigarettes, and chewing tobacco. If you need help quitting, ask your health care provider.  Monitor your blood pressure at home as told by your health care provider.  Keep all follow-up visits as told by your health care provider. This is important. Medicines  Take over-the-counter and prescription medicines only as told by your health care provider. Follow directions carefully. Blood pressure medicines must be taken as prescribed.  Do not skip doses of blood pressure medicine. Doing this puts you at risk for problems and can make the medicine less effective.  Ask your health care provider about side effects or reactions to medicines that you should watch for. Contact a health care provider if you:  Think you are having a reaction to a medicine you are taking.  Have headaches that keep coming back (recurring).  Feel dizzy.  Have swelling in your ankles.  Have trouble with your vision. Get help right away if you:  Develop a severe headache or confusion.  Have unusual weakness or numbness.  Feel faint.  Have severe pain in your chest or abdomen.  Vomit repeatedly.  Have trouble breathing. Summary  Hypertension is when the force of blood pumping through your arteries is too strong. If this condition is not controlled, it may put you at risk for serious complications.  Your personal target blood pressure may vary depending on your medical conditions, your age, and other factors. For most people, a normal blood pressure is less than 120/80.  Hypertension is treated with lifestyle changes, medicines, or a combination of both. Lifestyle changes include losing weight, eating a  healthy, low-sodium diet, exercising more, and limiting alcohol.  This information is not intended to replace advice given to you by your health care provider. Make sure you discuss any questions you have with your health care provider. Document Revised: 01/21/2018 Document Reviewed: 01/21/2018 Elsevier Patient Education  [place] DASH stands for "Dietary Approaches to Stop Hypertension." The DASH eating plan is a healthy eating plan that has been shown to reduce high blood pressure (hypertension). It may also reduce your risk for type 2 diabetes, heart disease, and stroke. The DASH eating plan may also help with weight loss. What are tips for following this plan?  General guidelines  Avoid eating more than 2,300 mg (milligrams) of salt (sodium) a day. If you have hypertension, you may need to reduce your sodium intake to 1,500 mg a day.  Limit alcohol intake to no more than 1 drink a day for nonpregnant women and 2 drinks a day for men. One drink equals 12 oz of beer, 5 oz of wine, or 1 oz of hard liquor.  Work with your health care provider to maintain a healthy body weight or to lose weight. Ask what an ideal weight is for you.  Get at least 30 minutes of exercise that causes your heart to beat faster (aerobic exercise) most days of the week. Activities may include walking, swimming, or biking.  Work with your health care provider or diet and nutrition specialist (dietitian) to adjust your eating plan to your individual calorie needs. Reading food labels   Check food labels for the amount of sodium per serving. Choose foods with less than 5 percent of the Daily Value of sodium. Generally, foods with less than 300 mg of sodium per serving fit into this eating plan.  To find whole grains, look for the word "whole" as the first word in the ingredient list. Shopping  Buy products labeled as "low-sodium" or "no salt added."  Buy fresh foods. Avoid canned foods  and premade or frozen meals. Cooking  Avoid adding salt when cooking. Use salt-free seasonings or herbs instead of table salt or sea salt. Check with your health care provider or pharmacist before using salt substitutes.  Do not fry foods. Cook foods using healthy methods such as baking, boiling, grilling, and broiling instead.  Cook with heart-healthy oils, such as olive, canola, soybean, or sunflower oil. Meal planning  Eat a balanced diet that includes: ? 5 or more servings of fruits and vegetables each day. At each meal, try to fill half of your plate with fruits and vegetables. ? Up to 6-8 servings of whole grains each day. ? Less than 6 oz of lean meat, poultry, or fish each day. A 3-oz serving of meat is about the same size as a deck of cards. One egg equals 1 oz. ? 2 servings of low-fat dairy each day. ? A serving of nuts, seeds, or beans 5 times each week. ? Heart-healthy fats. Healthy fats called Omega-3 fatty acids are found in foods such as flaxseeds and coldwater fish, like sardines, salmon, and mackerel.  Limit how much you eat of the following: ? Canned or prepackaged foods. ? Food that is high in trans fat, such as fried foods. ? Food that is high in saturated fat, such as fatty meat. ? Sweets, desserts, sugary drinks, and other foods with added sugar. ? Full-fat dairy products.  Do not salt foods before eating.  Try to eat at least 2 vegetarian meals each week.  Eat more home-cooked food and less  restaurant, buffet, and fast food.  When eating at a restaurant, ask that your food be prepared with less salt or no salt, if possible. What foods are recommended? The items listed may not be a complete list. Talk with your dietitian about what dietary choices are best for you. Grains Whole-grain or whole-wheat bread. Whole-grain or whole-wheat pasta. Brown rice. [place]. Bulgur. Whole-grain and low-sodium cereals. Pita bread. Low-fat, low-sodium crackers.  Whole-wheat flour tortillas. Vegetables Fresh or frozen vegetables (raw, steamed, roasted, or grilled). Low-sodium or reduced-sodium tomato and vegetable juice. Low-sodium or reduced-sodium tomato sauce and tomato paste. Low-sodium or reduced-sodium canned vegetables. Fruits All fresh, dried, or frozen fruit. Canned fruit in natural juice (without added sugar). Meat and other protein foods Skinless chicken or [place]. Ground chicken or [place]. Pork with fat trimmed off. Fish and seafood. Egg whites. Dried beans, peas, or lentils. Unsalted nuts, nut butters, and seeds. Unsalted canned beans. Lean cuts of beef with fat trimmed off. Low-sodium, lean deli meat. Dairy Low-fat (1%) or fat-free (skim) milk. Fat-free, low-fat, or reduced-fat cheeses. Nonfat, low-sodium ricotta or cottage cheese. Low-fat or nonfat yogurt. Low-fat, low-sodium cheese. Fats and oils Soft margarine without trans fats. Vegetable oil. Low-fat, reduced-fat, or light mayonnaise and salad dressings (reduced-sodium). Canola, safflower, olive, soybean, and sunflower oils. Avocado. Seasoning and other foods Herbs. Spices. Seasoning mixes without salt. Unsalted popcorn and pretzels. Fat-free sweets. What foods are not recommended? The items listed may not be a complete list. Talk with your dietitian about what dietary choices are best for you. Grains Baked goods made with fat, such as croissants, muffins, or some breads. Dry pasta or rice meal packs. Vegetables Creamed or fried vegetables. Vegetables in a cheese sauce. Regular canned vegetables (not low-sodium or reduced-sodium). Regular canned tomato sauce and paste (not low-sodium or reduced-sodium). Regular tomato and vegetable juice (not low-sodium or reduced-sodium). Angie Fava. Olives. Fruits Canned fruit in a light or heavy syrup. Fried fruit. Fruit in cream or butter sauce. Meat and other protein foods Fatty cuts of meat. Ribs. Fried meat. Berniece Salines. Sausage. Bologna and other  processed lunch meats. Salami. Fatback. Hotdogs. Bratwurst. Salted nuts and seeds. Canned beans with added salt. Canned or smoked fish. Whole eggs or egg yolks. Chicken or [place] with skin. Dairy Whole or 2% milk, cream, and half-and-half. Whole or full-fat cream cheese. Whole-fat or sweetened yogurt. Full-fat cheese. Nondairy creamers. Whipped toppings. Processed cheese and cheese spreads. Fats and oils Butter. Stick margarine. Lard. Shortening. Ghee. Bacon fat. Tropical oils, such as coconut, palm kernel, or palm oil. Seasoning and other foods Salted popcorn and pretzels. Onion salt, garlic salt, seasoned salt, table salt, and sea salt. Worcestershire sauce. Tartar sauce. Barbecue sauce. Teriyaki sauce. Soy sauce, including reduced-sodium. Steak sauce. Canned and packaged gravies. Fish sauce. Oyster sauce. Cocktail sauce. Horseradish that you find on the shelf. Ketchup. Mustard. Meat flavorings and tenderizers. Bouillon cubes. Hot sauce and Tabasco sauce. Premade or packaged marinades. Premade or packaged taco seasonings. Relishes. Regular salad dressings. Where to find more information:  National Heart, Lung, and [place]: https://wilson-eaton.com/  American Heart Association: www.heart.org Summary  The DASH eating plan is a healthy eating plan that has been shown to reduce high blood pressure (hypertension). It may also reduce your risk for type 2 diabetes, heart disease, and stroke.  With the DASH eating plan, you should limit salt (sodium) intake to 2,300 mg a day. If you have hypertension, you may need to reduce your sodium intake to 1,500 mg a day.  When on the DASH eating plan, aim to eat more fresh fruits and vegetables, whole grains, lean proteins, low-fat dairy, and heart-healthy fats.  Work with your health care provider or diet and nutrition specialist (dietitian) to adjust your eating plan to your individual calorie needs. This information is not intended to replace advice given to  you by your health care provider. Make sure you discuss any questions you have with your health care provider. Document Revised: 04/25/2017 Document Reviewed: 05/06/2016 Elsevier Patient Education  2020 [employer].

## 2020-05-17 ENCOUNTER — Other Ambulatory Visit: Payer: Managed Care, Other (non HMO)

## 2020-05-31 ENCOUNTER — Other Ambulatory Visit: Payer: Self-pay

## 2020-05-31 ENCOUNTER — Telehealth: Payer: Self-pay | Admitting: Internal Medicine

## 2020-05-31 ENCOUNTER — Ambulatory Visit (INDEPENDENT_AMBULATORY_CARE_PROVIDER_SITE_OTHER): Payer: Managed Care, Other (non HMO)

## 2020-05-31 VITALS — BP 142/90 | HR 88 | Temp 98.3°F

## 2020-05-31 DIAGNOSIS — I10 Essential (primary) hypertension: Secondary | ICD-10-CM | POA: Diagnosis not present

## 2020-05-31 NOTE — Telephone Encounter (Signed)
Patient came in today for blood pressure check. Patient asked if she should be taking both the amlodipine as well as the Losartan-HCTZ. I advised patient that after looking at chart that she should be on both.   Patient had misunderstood & was only taking the amlodipine. Her numbers at homes have been 150's/100 lowest being 140/88. When I check BP with large cuff I heard 142/90. I let patient go & advised that we would call her because I thought you may want her to come back after taking both medications for a few weeks. She will continue to monitor at home & will try to set up mychart to send [place] some readings.   She should be taking both norvasc 2.5 mg daily and losartan hctz 50-12.5 1/2 pill daily in the am  Take both and resch BP check in 2 weeks  Goal blood pressure <130/<80  Dr. French Ana [facility]

## 2020-05-31 NOTE — Progress Notes (Signed)
Patient came in today for blood pressure check. Patient asked if she should be taking both the amlodipine as well as the Losartan-HCTZ. I advised patient that after looking at chart that she should be on both. Patient had misunderstood & was only taking the amlodipine. Her numbers at homes have been 150's/100 lowest being 140/88. When I check BP with large cuff I heard 142/90. I let patient go & advised that we would call her because I thought you may want her to come back after taking both medications for a few weeks. She will continue to monitor at home & will try to set up mychart to send [place] some readings.

## 2020-06-01 NOTE — Telephone Encounter (Signed)
Left message to return call 

## 2020-06-16 ENCOUNTER — Encounter: Payer: Self-pay | Admitting: Internal Medicine

## 2020-06-23 NOTE — Telephone Encounter (Signed)
Left message to return call 

## 2020-10-25 ENCOUNTER — Other Ambulatory Visit: Payer: Self-pay | Admitting: Dermatology

## 2020-12-22 ENCOUNTER — Telehealth: Payer: Self-pay | Admitting: Internal Medicine

## 2020-12-22 ENCOUNTER — Other Ambulatory Visit: Payer: Self-pay | Admitting: Internal Medicine

## 2020-12-22 DIAGNOSIS — I10 Essential (primary) hypertension: Secondary | ICD-10-CM

## 2020-12-22 MED ORDER — AMLODIPINE BESYLATE 2.5 MG PO TABS: 2.5000 mg | ORAL_TABLET | Freq: Every day | ORAL | 3 refills | Status: DC

## 2020-12-22 NOTE — Telephone Encounter (Signed)
Patent needs a refill on her amLODipine (NORVASC) 2.5 MG tabletamLODipine (NORVASC) 2.5 MG tablet. Pharmacy is [facility] in [place].

## 2021-04-16 ENCOUNTER — Encounter: Payer: Managed Care, Other (non HMO) | Admitting: Dermatology

## 2021-07-13 ENCOUNTER — Telehealth: Payer: Self-pay | Admitting: Internal Medicine

## 2021-07-13 NOTE — Telephone Encounter (Signed)
Pt called in requesting for labs. No lab orders in system. Pt requesting callback

## 2021-07-16 NOTE — Telephone Encounter (Signed)
Patient scheduled to come in 07/26/21. Please advise on labs needed before appointment

## 2021-07-17 NOTE — Addendum Note (Signed)
Addended by: Orland Mustard on: 07/17/2021 12:38 PM   Modules accepted: Orders

## 2021-07-17 NOTE — Telephone Encounter (Signed)
Pt can schedule fasting labs orders in  Has she seen ob/gyn do do mammogram if not does she want [place] to order mammogram?

## 2021-07-18 NOTE — Telephone Encounter (Signed)
Left message to return call.  Please schedule Patient a lab appointment.   Okay to scheduled Patient for fasting labs any date. Okay to move physical appointment 3 days from lab date if no lab appointments available before 07/26/21.

## 2021-07-18 NOTE — Telephone Encounter (Signed)
Patient called and lab appointment made before having her appt. With Dr Olivia Mackie.

## 2021-07-20 ENCOUNTER — Other Ambulatory Visit (INDEPENDENT_AMBULATORY_CARE_PROVIDER_SITE_OTHER): Payer: 59

## 2021-07-20 ENCOUNTER — Other Ambulatory Visit: Payer: Self-pay

## 2021-07-20 DIAGNOSIS — R7303 Prediabetes: Secondary | ICD-10-CM

## 2021-07-20 DIAGNOSIS — E559 Vitamin D deficiency, unspecified: Secondary | ICD-10-CM | POA: Diagnosis not present

## 2021-07-20 DIAGNOSIS — Z1329 Encounter for screening for other suspected endocrine disorder: Secondary | ICD-10-CM | POA: Diagnosis not present

## 2021-07-20 DIAGNOSIS — Z1389 Encounter for screening for other disorder: Secondary | ICD-10-CM

## 2021-07-20 DIAGNOSIS — I10 Essential (primary) hypertension: Secondary | ICD-10-CM | POA: Diagnosis not present

## 2021-07-20 DIAGNOSIS — R5383 Other fatigue: Secondary | ICD-10-CM | POA: Diagnosis not present

## 2021-07-20 LAB — COMPREHENSIVE METABOLIC PANEL
ALT: 49 U/L — ABNORMAL HIGH (ref 0–35)
AST: 17 U/L (ref 0–37)
Albumin: 4.2 g/dL (ref 3.5–5.2)
Alkaline Phosphatase: 53 U/L (ref 39–117)
BUN: 14 mg/dL (ref 6–23)
CO2: 30 mEq/L (ref 19–32)
Calcium: 9 mg/dL (ref 8.4–10.5)
Chloride: 103 mEq/L (ref 96–112)
Creatinine, Ser: 0.77 mg/dL (ref 0.40–1.20)
GFR: 87.77 mL/min (ref >60.00)
Glucose, Bld: 129 mg/dL — ABNORMAL HIGH (ref 70–99)
Potassium: 3.8 mEq/L (ref 3.5–5.1)
Sodium: 139 mEq/L (ref 135–145)
Total Bilirubin: 0.5 mg/dL (ref 0.2–1.2)
Total Protein: 7.1 g/dL (ref 6.0–8.3)

## 2021-07-20 LAB — CBC WITH DIFFERENTIAL/PLATELET
Basophils Absolute: 0.1 10*3/uL (ref 0.0–0.1)
Basophils Relative: 0.9 % (ref 0.0–3.0)
Eosinophils Absolute: 0.1 10*3/uL (ref 0.0–0.7)
Eosinophils Relative: 2 % (ref 0.0–5.0)
HCT: 41.4 % (ref 36.0–46.0)
Hemoglobin: 14.1 g/dL (ref 12.0–15.0)
Lymphocytes Relative: 26.6 % (ref 12.0–46.0)
Lymphs Abs: 1.7 10*3/uL (ref 0.7–4.0)
MCHC: 34.2 g/dL (ref 30.0–36.0)
MCV: 87.7 fl (ref 78.0–100.0)
Monocytes Absolute: 0.5 10*3/uL (ref 0.1–1.0)
Monocytes Relative: 7.1 % (ref 3.0–12.0)
Neutro Abs: 4.1 10*3/uL (ref 1.4–7.7)
Neutrophils Relative %: 63.4 % (ref 43.0–77.0)
Platelets: 240 10*3/uL (ref 150.0–400.0)
RBC: 4.71 Mil/uL (ref 3.87–5.11)
RDW: 12.4 % (ref 11.5–15.5)
WBC: 6.4 10*3/uL (ref 4.0–10.5)

## 2021-07-20 LAB — LIPID PANEL
Cholesterol: 232 mg/dL — ABNORMAL HIGH (ref 0–200)
HDL: 41 mg/dL (ref >39.00)
NonHDL: 190.93
Total CHOL/HDL Ratio: 6
Triglycerides: 239 mg/dL — ABNORMAL HIGH (ref 0.0–149.0)
VLDL: 47.8 mg/dL — ABNORMAL HIGH (ref 0.0–40.0)

## 2021-07-20 LAB — HEMOGLOBIN A1C: Hgb A1c MFr Bld: 6.5 % (ref 4.6–6.5)

## 2021-07-20 LAB — VITAMIN D 25 HYDROXY (VIT D DEFICIENCY, FRACTURES): VITD: 32.99 ng/mL (ref 30.00–100.00)

## 2021-07-20 LAB — LDL CHOLESTEROL, DIRECT: Direct LDL: 155 mg/dL

## 2021-07-20 LAB — TSH: TSH: 0.74 u[IU]/mL (ref 0.35–5.50)

## 2021-07-21 LAB — URINALYSIS, ROUTINE W REFLEX MICROSCOPIC
Bacteria, UA: NONE SEEN /HPF
Bilirubin Urine: NEGATIVE
Glucose, UA: NEGATIVE
Hgb urine dipstick: NEGATIVE
Hyaline Cast: NONE SEEN /LPF
Ketones, ur: NEGATIVE
Leukocytes,Ua: NEGATIVE
Nitrite: NEGATIVE
RBC / HPF: NONE SEEN /HPF (ref 0–2)
Specific Gravity, Urine: 1.021 (ref 1.001–1.035)
WBC, UA: NONE SEEN /HPF (ref 0–5)
pH: 6 (ref 5.0–8.0)

## 2021-07-21 LAB — MICROSCOPIC MESSAGE

## 2021-07-26 ENCOUNTER — Other Ambulatory Visit: Payer: Self-pay

## 2021-07-26 ENCOUNTER — Encounter: Payer: Self-pay | Admitting: Internal Medicine

## 2021-07-26 ENCOUNTER — Ambulatory Visit (INDEPENDENT_AMBULATORY_CARE_PROVIDER_SITE_OTHER): Payer: 59 | Admitting: Internal Medicine

## 2021-07-26 VITALS — BP 136/86 | HR 98 | Temp 98.8°F | Ht 63.0 in | Wt 189.6 lb

## 2021-07-26 DIAGNOSIS — E2839 Other primary ovarian failure: Secondary | ICD-10-CM

## 2021-07-26 DIAGNOSIS — I10 Essential (primary) hypertension: Secondary | ICD-10-CM

## 2021-07-26 DIAGNOSIS — Z1211 Encounter for screening for malignant neoplasm of colon: Secondary | ICD-10-CM

## 2021-07-26 DIAGNOSIS — Z0000 Encounter for general adult medical examination without abnormal findings: Secondary | ICD-10-CM | POA: Diagnosis not present

## 2021-07-26 DIAGNOSIS — I152 Hypertension secondary to endocrine disorders: Secondary | ICD-10-CM

## 2021-07-26 DIAGNOSIS — R110 Nausea: Secondary | ICD-10-CM

## 2021-07-26 DIAGNOSIS — Z1231 Encounter for screening mammogram for malignant neoplasm of breast: Secondary | ICD-10-CM

## 2021-07-26 DIAGNOSIS — E1159 Type 2 diabetes mellitus with other circulatory complications: Secondary | ICD-10-CM | POA: Diagnosis not present

## 2021-07-26 MED ORDER — AMLODIPINE BESYLATE 2.5 MG PO TABS: 2.5000 mg | ORAL_TABLET | Freq: Every day | ORAL | 3 refills | Status: DC

## 2021-07-26 MED ORDER — ONDANSETRON HCL 4 MG PO TABS: 4.0000 mg | ORAL_TABLET | Freq: Two times a day (BID) | ORAL | 0 refills | Status: DC | PRN

## 2021-07-26 MED ORDER — OZEMPIC (0.25 OR 0.5 MG/DOSE) 2 MG/1.5ML ~~LOC~~ SOPN: 0.2500 mg | PEN_INJECTOR | SUBCUTANEOUS | 0 refills | Status: DC

## 2021-07-26 MED ORDER — LOSARTAN POTASSIUM-HCTZ 50-12.5 MG PO TABS: 1.0000 | ORAL_TABLET | Freq: Every day | ORAL | 3 refills | Status: DC

## 2021-07-26 NOTE — Progress Notes (Signed)
Chief Complaint  Patient presents with   Medication Management   Annual Exam   Annual  1. Htn elevated on 1/2 of losartan hctz 50-12.5 and norvasc 2.5 mg qd and +DM2 A1c 6.5 on labs  She tries to eat healthy at times other times does not due to stress from work  2. Hld will consider statin discussion at f/u  3. Obesity disc ozempic and pt agreeable to try this    Review of Systems  Constitutional:  Negative for weight loss.  HENT:  Negative for hearing loss.   Eyes:  Negative for blurred vision.  Respiratory:  Negative for shortness of breath.   Cardiovascular:  Negative for chest pain.  Gastrointestinal:  Negative for abdominal pain and blood in stool.  Genitourinary:  Negative for dysuria.  Musculoskeletal:  Negative for falls and joint pain.  Skin:  Negative for rash.  Neurological:  Negative for headaches.  Psychiatric/Behavioral:  Negative for depression.   Past Medical History:  Diagnosis Date   Allergic rhinitis    Hypertension    Vitamin D deficiency    Past Surgical History:  Procedure Laterality Date   CESAREAN SECTION     3   JOINT REPLACEMENT     hip raplacement (right)   Family History  Problem Relation Age of Onset   Stroke Mother    Hypertension Mother    Diabetes Mother    Hyperlipidemia Mother    CAD Mother        with stents x 2   Hypertension Father    Hyperlipidemia Father    Brain cancer Father    Sleep apnea Brother    Social History   Socioeconomic History   Marital status: Married    Spouse name: Not on file   Number of children: Not on file   Years of education: Not on file   Highest education level: Not on file  Occupational History   Not on file  Tobacco Use   Smoking status: Never   Smokeless tobacco: Never  Substance and Sexual Activity   Alcohol use: Yes    Alcohol/week: 1.0 standard drink    Types: 1 Standard drinks or equivalent per week   Drug use: No   Sexual activity: Not Currently    Partners: Male  Other Topics  Concern   Not on file  Social History Narrative   3 kids    Married to husband 89 dating since 37 college   Works with school    Husband [employer]    Social Determinants of Health   Financial Resource Strain: Not on file  Food Insecurity: Not on file  Transportation Needs: Not on file  Physical Activity: Not on file  Stress: Not on file  Social Connections: Not on file  Intimate Partner Violence: Not on file   Current Meds  Medication Sig   ondansetron (ZOFRAN) 4 MG tablet Take 1 tablet (4 mg total) by mouth 2 (two) times daily as needed.   Semaglutide,0.25 or 0.5MG /DOS, (OZEMPIC, 0.25 OR 0.5 MG/DOSE,) 2 MG/1.5ML SOPN Inject 0.25 mg into the skin once a week. Increase to 0.5 weekly month 2 if tolerating   [DISCONTINUED] amLODipine (NORVASC) 2.5 MG tablet TAKE ONE TABLET BY MOUTH DAILY   [DISCONTINUED] losartan-hydrochlorothiazide (HYZAAR) 50-12.5 MG tablet Take 0.5 tablets by mouth daily. In am   No Known Allergies Recent Results (from the past 2160 hour(s))  Vitamin D (25 hydroxy)     Status: None   Collection Time: 07/20/21  7:53 AM  Result Value Ref Range   VITD 32.99 30.00 - 100.00 ng/mL  Hemoglobin A1c     Status: None   Collection Time: 07/20/21  7:53 AM  Result Value Ref Range   Hgb A1c MFr Bld 6.5 4.6 - 6.5 %    Comment: Glycemic Control Guidelines for People with Diabetes:Non Diabetic:  <6%Goal of Therapy: <7%Additional Action Suggested:  >8%   Urinalysis, Routine w reflex microscopic     Status: Abnormal   Collection Time: 07/20/21  7:53 AM  Result Value Ref Range   Color, Urine YELLOW YELLOW   APPearance TURBID (A) CLEAR   Specific Gravity, Urine 1.021 1.001 - 1.035   pH 6.0 5.0 - 8.0   Glucose, UA NEGATIVE NEGATIVE   Bilirubin Urine NEGATIVE NEGATIVE   Ketones, ur NEGATIVE NEGATIVE   Hgb urine dipstick NEGATIVE NEGATIVE   Protein, ur TRACE (A) NEGATIVE   Nitrite NEGATIVE NEGATIVE   Leukocytes,Ua NEGATIVE NEGATIVE   WBC, UA NONE SEEN 0 - 5 /HPF   RBC /  HPF NONE SEEN 0 - 2 /HPF   Squamous Epithelial / LPF 0-5 < OR = 5 /HPF   Bacteria, UA NONE SEEN NONE SEEN /HPF   Hyaline Cast NONE SEEN NONE SEEN /LPF  TSH     Status: None   Collection Time: 07/20/21  7:53 AM  Result Value Ref Range   TSH 0.74 0.35 - 5.50 uIU/mL  CBC with Differential/Platelet     Status: None   Collection Time: 07/20/21  7:53 AM  Result Value Ref Range   WBC 6.4 4.0 - 10.5 K/uL   RBC 4.71 3.87 - 5.11 Mil/uL   Hemoglobin 14.1 12.0 - 15.0 g/dL   HCT 41.4 36.0 - 46.0 %   MCV 87.7 78.0 - 100.0 fl   MCHC 34.2 30.0 - 36.0 g/dL   RDW 12.4 11.5 - 15.5 %   Platelets 240.0 150.0 - 400.0 K/uL   Neutrophils Relative % 63.4 43.0 - 77.0 %   Lymphocytes Relative 26.6 12.0 - 46.0 %   Monocytes Relative 7.1 3.0 - 12.0 %   Eosinophils Relative 2.0 0.0 - 5.0 %   Basophils Relative 0.9 0.0 - 3.0 %   Neutro Abs 4.1 1.4 - 7.7 K/uL   Lymphs Abs 1.7 0.7 - 4.0 K/uL   Monocytes Absolute 0.5 0.1 - 1.0 K/uL   Eosinophils Absolute 0.1 0.0 - 0.7 K/uL   Basophils Absolute 0.1 0.0 - 0.1 K/uL  Lipid panel     Status: Abnormal   Collection Time: 07/20/21  7:53 AM  Result Value Ref Range   Cholesterol 232 (H) 0 - 200 mg/dL    Comment: ATP III Classification       Desirable:  < 200 mg/dL               Borderline High:  200 - 239 mg/dL          High:  > = 240 mg/dL   Triglycerides 239.0 (H) 0.0 - 149.0 mg/dL    Comment: Normal:  <150 mg/dLBorderline High:  150 - 199 mg/dL   HDL 41.00 >39.00 mg/dL   VLDL 47.8 (H) 0.0 - 40.0 mg/dL   Total CHOL/HDL Ratio 6     Comment:                Men          Women1/2 Average Risk     3.4  3.3Average Risk          5.0          4.42X Average Risk          9.6          7.13X Average Risk          15.0          11.0                       NonHDL 190.93     Comment: NOTE:  Non-HDL goal should be 30 mg/dL higher than patient's LDL goal (i.e. LDL goal of < 70 mg/dL, would have non-HDL goal of < 100 mg/dL)  Comprehensive metabolic panel     Status: Abnormal    Collection Time: 07/20/21  7:53 AM  Result Value Ref Range   Sodium 139 135 - 145 mEq/L   Potassium 3.8 3.5 - 5.1 mEq/L   Chloride 103 96 - 112 mEq/L   CO2 30 19 - 32 mEq/L   Glucose, Bld 129 (H) 70 - 99 mg/dL   BUN 14 6 - 23 mg/dL   Creatinine, Ser 0.77 0.40 - 1.20 mg/dL   Total Bilirubin 0.5 0.2 - 1.2 mg/dL   Alkaline Phosphatase 53 39 - 117 U/L   AST 17 0 - 37 U/L   ALT 49 (H) 0 - 35 U/L   Total Protein 7.1 6.0 - 8.3 g/dL   Albumin 4.2 3.5 - 5.2 g/dL   GFR 87.77 >60.00 mL/min    Comment: Calculated using the CKD-EPI Creatinine Equation (2021)   Calcium 9.0 8.4 - 10.5 mg/dL  LDL cholesterol, direct     Status: None   Collection Time: 07/20/21  7:53 AM  Result Value Ref Range   Direct LDL 155.0 mg/dL    Comment: Optimal:  <100 mg/dLNear or Above Optimal:  100-129 mg/dLBorderline High:  130-159 mg/dLHigh:  160-189 mg/dLVery High:  >190 mg/dL  MICROSCOPIC MESSAGE     Status: None   Collection Time: 07/20/21  7:53 AM  Result Value Ref Range   Note      Comment: This urine was analyzed for the presence of WBC,  RBC, bacteria, casts, and other formed elements.  Only those elements seen were reported. . .    Objective  Body mass index is 33.59 kg/m. Wt Readings from Last 3 Encounters:  07/26/21 189 lb 9.6 oz (86 kg)  05/16/20 186 lb 8 oz (84.6 kg)  11/12/19 183 lb 6.4 oz (83.2 kg)   Temp Readings from Last 3 Encounters:  07/26/21 98.8 F (37.1 C) (Oral)  05/31/20 98.3 F (36.8 C)  05/16/20 98.4 F (36.9 C) (Oral)   BP Readings from Last 3 Encounters:  07/26/21 136/86  05/31/20 (!) 142/90  05/16/20 (!) 150/100   Pulse Readings from Last 3 Encounters:  07/26/21 98  05/31/20 88  05/16/20 (!) 101    Physical Exam Vitals and nursing note reviewed.  Constitutional:      Appearance: Normal appearance. She is well-developed and well-groomed.  HENT:     Head: Normocephalic and atraumatic.  Eyes:     Conjunctiva/sclera: Conjunctivae normal.     Pupils: Pupils  are equal, round, and reactive to light.  Cardiovascular:     Rate and Rhythm: Normal rate and regular rhythm.     Heart sounds: Normal heart sounds. No murmur heard. Pulmonary:     Effort: Pulmonary effort is normal.     Breath sounds:  Normal breath sounds.  Abdominal:     General: Abdomen is flat. Bowel sounds are normal.     Tenderness: There is no abdominal tenderness.  Musculoskeletal:        General: No tenderness.  Skin:    General: Skin is warm and dry.  Neurological:     General: No focal deficit present.     Mental Status: She is alert and oriented to person, place, and time. Mental status is at baseline.     Cranial Nerves: Cranial nerves 2-12 are intact.     Motor: Motor function is intact.     Coordination: Coordination is intact.     Gait: Gait is intact.  Psychiatric:        Attention and Perception: Attention and perception normal.        Mood and Affect: Mood and affect normal.        Speech: Speech normal.        Behavior: Behavior normal. Behavior is cooperative.        Thought Content: Thought content normal.        Cognition and Memory: Cognition and memory normal.        Judgment: Judgment normal.    Assessment  Plan  Annual physical exam See below   DM2 with 6.5 with uncontrolled htn, hld, obesity - Plan: amLODipine (NORVASC) 2.5 MG tablet  Plan: losartan-hydrochlorothiazide (HYZAAR) 50-12.5 MG tablet take 1 pill instead of 1/2 pill  Add ozempic 0.25 weekly titrate as tolerated with prn zofran Foot exam and eye exam in the future   HM Fasting labs 07/20/21 Declines flu shot  covid 2/2 moderna consider booster  Consider shingrix in the future disc today   Tdap 11/12/19  Pap nl 03/08/19 Dr. Garwin Brothers Mammogram -she reports gets q 2 years with ob/gyn seeing since 1995  -03/07/21   Mammo,dexa ordered [place]   Ordered dexa FH osteoporosis   Colonoscopy referral   rec healthy diet and exercise   On vitamin D 2000 IU qd    Provider: Dr. Olivia Mackie  [facility]

## 2021-07-26 NOTE — Patient Instructions (Addendum)
Dr. Garwin Brothers   Clinic Name [facility] Provider Organization [facility] Address [address] [place], [address] Phone Number [phone] Fax Number [phone]   Pap due 03/07/22   [facility]  [phone] Not available [place] [id number]      Specialties     Gastroenterology            Semaglutide Injection What is this medication? SEMAGLUTIDE (SEM a GLOO tide) treats type 2 diabetes. It works by increasing insulin levels in your body, which decreases your blood sugar (glucose). It also reduces the amount of sugar released into the blood and slows down your digestion. It can also be used to lower the risk of heart attack and stroke in people with type 2 diabetes. Changes to diet and exercise are often combined with this medication. This medicine may be used for other purposes; ask your health care provider or pharmacist if you have questions. COMMON BRAND NAME(S): OZEMPIC What should I tell my care team before I take this medication? They need to know if you have any of these conditions: Endocrine tumors (MEN 2) or if someone in your family had these tumors Eye disease, vision problems History of pancreatitis Kidney disease Stomach problems Thyroid cancer or if someone in your family had thyroid cancer An unusual or allergic reaction to semaglutide, other medications, foods, dyes, or preservatives Pregnant or trying to get pregnant Breast-feeding How should I use this medication? This medication is for injection under the skin of your upper leg (thigh), stomach area, or upper arm. It is given once every week (every 7 days). You will be taught how to prepare and give this medication. Use exactly as directed. Take your medication at regular intervals. Do not take it more often than directed. If you use this medication with insulin, you should inject this medication and the  insulin separately. Do not mix them together. Do not give the injections right next to each other. Change (rotate) injection sites with each injection. It is important that you put your used needles and syringes in a special sharps container. Do not put them in a trash can. If you do not have a sharps container, call your pharmacist or care team to get one. A special MedGuide will be given to you by the pharmacist with each prescription and refill. Be sure to read this information carefully each time. This medication comes with INSTRUCTIONS FOR USE. Ask your pharmacist for directions on how to use this medication. Read the information carefully. Talk to your pharmacist or care team if you have questions. Talk to your care team about the use of this medication in children. Special care may be needed. Overdosage: If you think you have taken too much of this medicine contact a poison control center or emergency room at once. NOTE: This medicine is only for you. Do not share this medicine with others. What if I miss a dose? If you miss a dose, take it as soon as you can within 5 days after the missed dose. Then take your next dose at your regular weekly time. If it has been longer than 5 days after the missed dose, do not take the missed dose. Take the next dose at your regular time. Do not take double or extra doses. If you have questions about a missed dose, contact your care team for advice. What may interact with this medication? Other medications  for diabetes Many medications may cause changes in blood sugar, these include: Alcohol containing beverages Antiviral medications for HIV or AIDS Aspirin and aspirin-like medications Certain medications for blood pressure, heart disease, irregular heart beat Chromium Diuretics Female hormones, such as estrogens or progestins, birth control pills Fenofibrate Gemfibrozil Isoniazid Lanreotide Female hormones or anabolic steroids MAOIs like Carbex,  Eldepryl, Marplan, Nardil, and Parnate Medications for weight loss Medications for allergies, asthma, cold, or cough Medications for depression, anxiety, or psychotic disturbances Niacin Nicotine NSAIDs, medications for pain and inflammation, like ibuprofen or naproxen Octreotide Pasireotide Pentamidine Phenytoin Probenecid Quinolone antibiotics such as ciprofloxacin, levofloxacin, ofloxacin Some herbal dietary supplements Steroid medications such as prednisone or cortisone Sulfamethoxazole; trimethoprim Thyroid hormones Some medications can hide the warning symptoms of low blood sugar (hypoglycemia). You may need to monitor your blood sugar more closely if you are taking one of these medications. These include: Beta-blockers, often used for high blood pressure or heart problems (examples include atenolol, metoprolol, propranolol) Clonidine Guanethidine Reserpine This list may not describe all possible interactions. Give your health care provider a list of all the medicines, herbs, non-prescription drugs, or dietary supplements you use. Also tell them if you smoke, drink alcohol, or use illegal drugs. Some items may interact with your medicine. What should I watch for while using this medication? Visit your care team for regular checks on your progress. Drink plenty of fluids while taking this medication. Check with your care team if you get an attack of severe diarrhea, nausea, and vomiting. The loss of too much body fluid can make it dangerous for you to take this medication. A test called the HbA1C (A1C) will be monitored. This is a simple blood test. It measures your blood sugar control over the last 2 to 3 months. You will receive this test every 3 to 6 months. Learn how to check your blood sugar. Learn the symptoms of low and high blood sugar and how to manage them. Always carry a quick-source of sugar with you in case you have symptoms of low blood sugar. Examples include hard  sugar candy or glucose tablets. Make sure others know that you can choke if you eat or drink when you develop serious symptoms of low blood sugar, such as seizures or unconsciousness. They must get medical help at once. Tell your care team if you have high blood sugar. You might need to change the dose of your medication. If you are sick or exercising more than usual, you might need to change the dose of your medication. Do not skip meals. Ask your care team if you should avoid alcohol. Many nonprescription cough and cold products contain sugar or alcohol. These can affect blood sugar. Pens should never be shared. Even if the needle is changed, sharing may result in passing of viruses like hepatitis or HIV. Wear a medical ID bracelet or chain, and carry a card that describes your disease and details of your medication and dosage times. Do not become pregnant while taking this medication. Women should inform their care team if they wish to become pregnant or think they might be pregnant. There is a potential for serious side effects to an unborn child. Talk to your care team for more information. What side effects may I notice from receiving this medication? Side effects that you should report to your care team as soon as possible: Allergic reactions--skin rash, itching, hives, swelling of the face, lips, tongue, or throat Change in vision Dehydration--increased thirst, dry mouth, feeling  faint or lightheaded, headache, dark yellow or brown urine Gallbladder problems--severe stomach pain, nausea, vomiting, fever Heart palpitations--rapid, pounding, or irregular heartbeat Kidney injury--decrease in the amount of urine, swelling of the ankles, hands, or feet Pancreatitis--severe stomach pain that spreads to your back or gets worse after eating or when touched, fever, nausea, vomiting Thyroid cancer--new mass or lump in the neck, pain or trouble swallowing, trouble breathing, hoarseness Side effects that  usually do not require medical attention (report to your care team if they continue or are bothersome): Diarrhea Loss of appetite Nausea Stomach pain Vomiting This list may not describe all possible side effects. Call your doctor for medical advice about side effects. You may report side effects to FDA at 1-800-FDA-1088. Where should I keep my medication? Keep out of the reach of children. Store unopened pens in a refrigerator between 2 and 8 degrees C (36 and 46 degrees F). Do not freeze. Protect from light and heat. After you first use the pen, it can be stored for 56 days at room temperature between 15 and 30 degrees C (59 and 86 degrees F) or in a refrigerator. Throw away your used pen after 56 days or after the expiration date, whichever comes first. Do not store your pen with the needle attached. If the needle is left on, medication may leak from the pen. NOTE: This sheet is a summary. It may not cover all possible information. If you have questions about this medicine, talk to your doctor, pharmacist, or health care provider.  2022 Elsevier/Gold Standard (2020-08-17 00:00:00)  Acanthosis Nigricans Acanthosis nigricans is a condition in which dark, velvety markings appear on the skin. What are the causes? This condition may be caused by: A hormonal or glandular disorder, such as diabetes. Obesity. Certain medicines, such as birth control pills. A tumor. This is rare. Some people inherit the condition from their parents. What increases the risk? You are more likely to develop this condition if you: Have a hormonal or glandular disorder. Are overweight. Take certain medicines. Have certain cancers, especially stomach cancer. Have dark-colored skin (dark complexion). What are the signs or symptoms? The main symptom of this condition is velvety markings on the skin that are light brown, black, or grayish in color. The markings usually appear on the face. They may also appear in  skin fold areas at the neck, armpits, inner thighs, and groin. In severe cases, markings may also appear on the lips, hands, breasts, eyelids, and mouth. How is this diagnosed? This condition may be diagnosed based on your symptoms. A skin sample may be removed for testing (skin biopsy). You may also have tests to help determine the cause of the condition. How is this treated? Treatment for this condition depends on the cause. Treatment may involve reducing insulin levels, which are often high in people who have this condition. Insulin levels can be reduced with: Dietary changes, such as avoiding starchy foods and sugars. Losing weight. Medicines. Sometimes, treatment involves: Medicines to improve the appearance of the skin. Laser treatment to improve the appearance of the skin. Surgical removal of the skin markings (dermabrasion). Follow these instructions at home: Follow diet instructions from your health care provider. Lose weight if you are overweight. Take over-the-counter and prescription medicines only as told by your health care provider. Keep all follow-up visits as told by your health care provider. This is important. Contact a health care provider if: New skin markings develop on a part of the body where they rarely develop,  such as on your lips, hands, breasts, eyelids, or mouth. The condition recurs for an unknown reason. Summary Acanthosis nigricans is a condition in which dark, velvety markings appear on the skin. Treatment for this condition depends on the cause. Treatment may include dietary changes, medicines, laser treatment, or surgery. Take over-the-counter and prescription medicines only as told by your health care provider. Contact a health care provider if new skin markings develop on a part of the body where they rarely develop, such as on your lips, hands, breasts, eyelids, or mouth. Keep all follow-up visits as told by your health care provider. This is  important. This information is not intended to replace advice given to you by your health care provider. Make sure you discuss any questions you have with your health care provider. Document Revised: 09/22/2017 Document Reviewed: 09/22/2017 Elsevier Patient Education  [place].

## 2021-07-30 ENCOUNTER — Telehealth: Payer: Self-pay | Admitting: Internal Medicine

## 2021-07-30 NOTE — Telephone Encounter (Signed)
Prior authorization has been submitted for patient's Ozempic  ? ?Awaiting approval or denial.  ? ?

## 2021-07-30 NOTE — Telephone Encounter (Signed)
Pt called in stating her medication- ozempic is over 1000 and she can not afford it. Pt want to know if the provider can write a note stating pt is diabetic so the cost will go down ?

## 2021-07-31 NOTE — Telephone Encounter (Signed)
Pt called in stating her medication- ozempic is over 1000 and she can not afford it.Pt stated that she is a diabetic. Pt stated that insurance is denying the medication. Pt requesting callback  ?

## 2021-08-01 NOTE — Telephone Encounter (Signed)
Is pt agreeable to try metformin 1st for diabetes?  ? ?

## 2021-08-01 NOTE — Telephone Encounter (Signed)
Note on denial from Patient insurance company: ? ?"The request for coverage for OZEMPIC INJ 2/1.5ML, use as directed (1.73m per month), is denied. This ?decision is based on health plan criteria for OZEMPIC INJ 2/1.5ML. This medicine is covered only if: ?You have a history of suboptimal response, contraindication or intolerance to metformin (generic ?Glucophage, Glucophage XR). ?The information provided does not show that you meet the criteria listed above. ?The reason(s) [employer] did not approve this medication can be found above. This denial is based on ?our Ozempic drug coverage" ? ?Please advise   ?

## 2021-08-02 MED ORDER — METFORMIN HCL ER 500 MG PO TB24: 500.0000 mg | ORAL_TABLET | Freq: Every day | ORAL | 3 refills | Status: DC

## 2021-08-02 NOTE — Addendum Note (Signed)
Addended by: Orland Mustard on: 08/02/2021 05:34 PM ? ? Modules accepted: Orders ? ?

## 2021-08-02 NOTE — Telephone Encounter (Signed)
Patient agreeable to send Metformin to [facility] in [place]  ?

## 2021-10-01 NOTE — Progress Notes (Signed)
Informed pt to be fasting on upcoming appt 10/31/21. Pt verbalized understanding.  ?

## 2021-10-01 NOTE — Progress Notes (Signed)
S/w pt about taking another norvasc 2.5 mg w/BP spikings and if this does not help to take another Hyzaar. Pt verbalized understanding. Informed her as well about her metformin and what exactly that helps with and Dr.Tracy will disc about going back to ozempic once she has tried metformin. ?Informed pt to reduce salt intake in diet and processed foods ?Goal for bp <130/<80. ?Pt verbalized understanding to all.  ?

## 2021-10-01 NOTE — Progress Notes (Signed)
Dimas Tzintzun, Karna Dupes, [place]  McLean-Scocuzza, Nino Glow, MD ?Informed pt to be fasting for upcoming appt. Pt stated her BP's have been fluctuating the past couple days with it normally being 135/82 and at times spikes up to 190/91. Pt has been taking BP meds as prescribed. She would also like to know what her diabetes meds are actually helping with. Thanks.  ? ? ?She can try another norvasc 2.5 mg with blood pressure spiking if this does not help another hyzaar  ?Make sure pt has f/u  ?Reduce salt intake in diet and processed fast foods  ?Goal <130/<80  ?Metformin treats diabetes and used to be an old school treatment for weight loss  ?We can try to see if her insurance will cover ozempic shot again at f/u  ? ?They wanted her to try metformin first  ? ?

## 2021-10-31 ENCOUNTER — Encounter: Payer: Self-pay | Admitting: Internal Medicine

## 2021-10-31 ENCOUNTER — Ambulatory Visit (INDEPENDENT_AMBULATORY_CARE_PROVIDER_SITE_OTHER): Payer: 59 | Admitting: Internal Medicine

## 2021-10-31 VITALS — BP 140/80 | HR 85 | Temp 98.4°F | Resp 12 | Ht 63.0 in | Wt 187.8 lb

## 2021-10-31 DIAGNOSIS — E1159 Type 2 diabetes mellitus with other circulatory complications: Secondary | ICD-10-CM

## 2021-10-31 DIAGNOSIS — E785 Hyperlipidemia, unspecified: Secondary | ICD-10-CM

## 2021-10-31 DIAGNOSIS — Z1211 Encounter for screening for malignant neoplasm of colon: Secondary | ICD-10-CM

## 2021-10-31 DIAGNOSIS — I152 Hypertension secondary to endocrine disorders: Secondary | ICD-10-CM

## 2021-10-31 DIAGNOSIS — E119 Type 2 diabetes mellitus without complications: Secondary | ICD-10-CM

## 2021-10-31 DIAGNOSIS — Z6833 Body mass index (BMI) 33.0-33.9, adult: Secondary | ICD-10-CM

## 2021-10-31 DIAGNOSIS — Z0100 Encounter for examination of eyes and vision without abnormal findings: Secondary | ICD-10-CM

## 2021-10-31 LAB — BASIC METABOLIC PANEL
BUN: 19 mg/dL (ref 6–23)
CO2: 25 mEq/L (ref 19–32)
Calcium: 9.6 mg/dL (ref 8.4–10.5)
Chloride: 102 mEq/L (ref 96–112)
Creatinine, Ser: 0.72 mg/dL (ref 0.40–1.20)
GFR: 94.94 mL/min (ref >60.00)
Glucose, Bld: 109 mg/dL — ABNORMAL HIGH (ref 70–99)
Potassium: 4 mEq/L (ref 3.5–5.1)
Sodium: 137 mEq/L (ref 135–145)

## 2021-10-31 MED ORDER — LOSARTAN POTASSIUM-HCTZ 100-25 MG PO TABS: 1.0000 | ORAL_TABLET | Freq: Every day | ORAL | 3 refills | Status: DC

## 2021-10-31 MED ORDER — SEMAGLUTIDE(0.25 OR 0.5MG/DOS) 2 MG/3ML ~~LOC~~ SOPN
0.2500 mg | PEN_INJECTOR | SUBCUTANEOUS | 2 refills | Status: DC
Start: 2021-10-31 — End: 2022-01-25

## 2021-10-31 NOTE — Patient Instructions (Addendum)
Dr. Shelby [facility]  [facility]   [place], [place] [address]   Phone: [phone]   Fax: [phone]     Dr. Wallace Going   Phone Fax E-mail Address  [phone] Not available [address]    [place] [address]     Specialties     Ophthalmology       Please call and schedule and bone density   Pap due 03/07/22 Dr. Louie Casa   [address], [place], [place] [address] Hours:  Open ? Closes 5?PM Phone: [phone]

## 2021-10-31 NOTE — Progress Notes (Signed)
Chief Complaint  Patient presents with   Follow-up    3 mon, disc colonoscopy referral, mammo sch for end of july   F/u  1. Htn and dm 2 A1c 6.5 on metformin not controlling appetite and with hld try to get ozempic approved on norvasc 2.5 and hyzaar 50-12.5 x 2 pills daily and bp still elevated in sbp 130s/80s-90s But improved   Review of Systems  Constitutional:  Negative for weight loss.  HENT:  Negative for hearing loss.   Eyes:  Negative for blurred vision.  Respiratory:  Negative for shortness of breath.   Cardiovascular:  Negative for chest pain.  Gastrointestinal:  Negative for abdominal pain and blood in stool.  Genitourinary:  Negative for dysuria.  Musculoskeletal:  Negative for falls and joint pain.  Skin:  Negative for rash.  Neurological:  Negative for headaches.  Psychiatric/Behavioral:  Negative for depression.   Past Medical History:  Diagnosis Date   Allergic rhinitis    COVID-19    Hypertension    Vitamin D deficiency    Past Surgical History:  Procedure Laterality Date   CESAREAN SECTION     3   JOINT REPLACEMENT     hip raplacement (right)   Family History  Problem Relation Age of Onset   Stroke Mother    Hypertension Mother    Diabetes Mother    Hyperlipidemia Mother    CAD Mother        with stents x 2   Hypertension Father    Hyperlipidemia Father    Brain cancer Father    Sleep apnea Brother    Social History   Socioeconomic History   Marital status: Married    Spouse name: Not on file   Number of children: Not on file   Years of education: Not on file   Highest education level: Not on file  Occupational History   Not on file  Tobacco Use   Smoking status: Never   Smokeless tobacco: Never  Substance and Sexual Activity   Alcohol use: Yes    Alcohol/week: 1.0 standard drink    Types: 1 Standard drinks or equivalent per week   Drug use: No   Sexual activity: Not Currently    Partners: Male  Other Topics Concern   Not on file   Social History Narrative   3 kids    Married to husband 61 dating since 55 college   Works with school    Husband [employer]    Social Determinants of Health   Financial Resource Strain: Not on file  Food Insecurity: Not on file  Transportation Needs: Not on file  Physical Activity: Not on file  Stress: Not on file  Social Connections: Not on file  Intimate Partner Violence: Not on file   Current Meds  Medication Sig   amLODipine (NORVASC) 2.5 MG tablet Take 1 tablet (2.5 mg total) by mouth daily.   losartan-hydrochlorothiazide (HYZAAR) 100-25 MG tablet Take 1 tablet by mouth daily. D/c 50-12.5 mg qd   metFORMIN (GLUCOPHAGE-XR) 500 MG 24 hr tablet Take 1 tablet (500 mg total) by mouth daily with breakfast.   Semaglutide,0.25 or 0.'5MG'$ /DOS, 2 MG/3ML SOPN Inject 0.25 mg into the skin once a week. X 1 month, increase to 0.5 weekly month tried metformin w/o work   [DISCONTINUED] losartan-hydrochlorothiazide (HYZAAR) 50-12.5 MG tablet Take 1 tablet by mouth daily. In am   No Known Allergies No results found for this or any previous visit (from the past 2160  hour(s)). Objective  Body mass index is 33.27 kg/m. Wt Readings from Last 3 Encounters:  10/31/21 187 lb 12.8 oz (85.2 kg)  07/26/21 189 lb 9.6 oz (86 kg)  05/16/20 186 lb 8 oz (84.6 kg)   Temp Readings from Last 3 Encounters:  10/31/21 98.4 F (36.9 C) (Oral)  07/26/21 98.8 F (37.1 C) (Oral)  05/31/20 98.3 F (36.8 C)   BP Readings from Last 3 Encounters:  10/31/21 140/80  07/26/21 136/86  05/31/20 (!) 142/90   Pulse Readings from Last 3 Encounters:  10/31/21 85  07/26/21 98  05/31/20 88    Physical Exam Vitals and nursing note reviewed.  Constitutional:      Appearance: Normal appearance. She is well-developed and well-groomed.  HENT:     Head: Normocephalic and atraumatic.  Eyes:     Conjunctiva/sclera: Conjunctivae normal.     Pupils: Pupils are equal, round, and reactive to light.  Cardiovascular:      Rate and Rhythm: Normal rate and regular rhythm.     Heart sounds: Normal heart sounds. No murmur heard. Pulmonary:     Effort: Pulmonary effort is normal.     Breath sounds: Normal breath sounds.  Abdominal:     General: Abdomen is flat. Bowel sounds are normal.     Tenderness: There is no abdominal tenderness.  Musculoskeletal:        General: No tenderness.  Skin:    General: Skin is warm and dry.  Neurological:     General: No focal deficit present.     Mental Status: She is alert and oriented to person, place, and time. Mental status is at baseline.     Cranial Nerves: Cranial nerves 2-12 are intact.     Motor: Motor function is intact.     Coordination: Coordination is intact.     Gait: Gait is intact.  Psychiatric:        Attention and Perception: Attention and perception normal.        Mood and Affect: Mood and affect normal.        Speech: Speech normal.        Behavior: Behavior normal. Behavior is cooperative.        Thought Content: Thought content normal.        Cognition and Memory: Cognition and memory normal.        Judgment: Judgment normal.    Assessment  Plan  BMI 33.0-33.9,adult - Plan: Semaglutide,0.25 or 0.'5MG'$ /DOS, 2 MG/3ML SOPN  Hypertension associated with diabetes ([place]) - Plan: Semaglutide,0.25 or 0.'5MG'$ /DOS, 2 MG/3ML SOPN, Comprehensive metabolic panel, Lipid panel, Hemoglobin A1c, CBC with Differential/Platelet, Microalbumin / creatinine urine ratio, losartan-hydrochlorothiazide (HYZAAR) 100-25 MG tablet, Basic Metabolic Panel (BMET) Norvasc 2.5 mg qd  Hyperlipidemia, unspecified hyperlipidemia type - Plan: Semaglutide,0.25 or 0.'5MG'$ /DOS, 2 MG/3ML SOPN  Encounter for screening colonoscopy - Plan: Ambulatory referral to Gastroenterology  Diabetic eye exam [facility]) - Plan: Ambulatory referral to Ophthalmology  HM Fasting labs 07/20/21 Declines flu shot  covid 2/2 moderna consider booster  Consider shingrix in the future disc today   Tdap 11/12/19   Pap nl 03/08/19 Dr. Garwin Brothers Mammogram -she reports gets q 2 years with ob/gyn seeing since 1995  -03/07/21   Mammo,dexa ordered [place]    Ordered dexa FH osteoporosis    Colonoscopy referral    rec healthy diet and exercise   On vitamin D 2000 IU qd    Provider: Dr. Olivia Mackie McLean-Scocuzza-Internal Medicine

## 2021-12-13 LAB — HM DIABETES EYE EXAM

## 2021-12-31 LAB — HM COLONOSCOPY

## 2022-01-03 LAB — HM DIABETES EYE EXAM

## 2022-01-09 ENCOUNTER — Other Ambulatory Visit: Payer: Self-pay

## 2022-01-14 ENCOUNTER — Encounter: Payer: Self-pay | Admitting: Internal Medicine

## 2022-01-17 ENCOUNTER — Ambulatory Visit: Payer: Self-pay | Admitting: Internal Medicine

## 2022-01-24 ENCOUNTER — Ambulatory Visit: Payer: Self-pay | Admitting: Internal Medicine

## 2022-01-25 ENCOUNTER — Encounter: Payer: Self-pay | Admitting: Internal Medicine

## 2022-01-25 ENCOUNTER — Ambulatory Visit (INDEPENDENT_AMBULATORY_CARE_PROVIDER_SITE_OTHER): Payer: Managed Care, Other (non HMO) | Admitting: Internal Medicine

## 2022-01-25 VITALS — BP 118/86 | HR 87 | Temp 98.6°F | Ht 63.0 in | Wt 187.0 lb

## 2022-01-25 DIAGNOSIS — E1159 Type 2 diabetes mellitus with other circulatory complications: Secondary | ICD-10-CM | POA: Diagnosis not present

## 2022-01-25 DIAGNOSIS — I10 Essential (primary) hypertension: Secondary | ICD-10-CM

## 2022-01-25 DIAGNOSIS — Z1231 Encounter for screening mammogram for malignant neoplasm of breast: Secondary | ICD-10-CM | POA: Diagnosis not present

## 2022-01-25 DIAGNOSIS — E2839 Other primary ovarian failure: Secondary | ICD-10-CM

## 2022-01-25 DIAGNOSIS — I152 Hypertension secondary to endocrine disorders: Secondary | ICD-10-CM

## 2022-01-25 DIAGNOSIS — Z23 Encounter for immunization: Secondary | ICD-10-CM

## 2022-01-25 MED ORDER — AMLODIPINE BESYLATE 2.5 MG PO TABS: 2.5000 mg | ORAL_TABLET | Freq: Every day | ORAL | 3 refills | Status: DC

## 2022-01-25 MED ORDER — SHINGRIX 50 MCG/0.5ML IM SUSR: 0.5000 mL | Freq: Once | INTRAMUSCULAR | 1 refills | Status: AC

## 2022-01-25 MED ORDER — METFORMIN HCL ER 500 MG PO TB24: 500.0000 mg | ORAL_TABLET | Freq: Every day | ORAL | 3 refills | Status: DC

## 2022-01-25 MED ORDER — LOSARTAN POTASSIUM-HCTZ 100-25 MG PO TABS: 1.0000 | ORAL_TABLET | Freq: Every day | ORAL | 3 refills | Status: DC

## 2022-01-25 NOTE — Patient Instructions (Addendum)
Colonoscopy due 12/31/2024 Dr. Leana Roe mammogram +bone density call for an appt  Zoster Vaccine, Recombinant injection What is this medication? ZOSTER VACCINE (ZOS ter vak SEEN) is a vaccine used to reduce the risk of getting shingles. This vaccine is not used to treat shingles or nerve pain from shingles. This medicine may be used for other purposes; ask your health care provider or pharmacist if you have questions. COMMON BRAND NAME(S): [facility] What should I tell my care team before I take this medication? They need to know if you have any of these conditions: cancer immune system problems an unusual or allergic reaction to Zoster vaccine, other medications, foods, dyes, or preservatives pregnant or trying to get pregnant breast-feeding How should I use this medication? This vaccine is injected into a muscle. It is given by a health care provider. A copy of Vaccine Information Statements will be given before each vaccination. Be sure to read this information carefully each time. This sheet may change often. Talk to your health care provider about the use of this vaccine in children. This vaccine is not approved for use in children. Overdosage: If you think you have taken too much of this medicine contact a poison control center or emergency room at once. NOTE: This medicine is only for you. Do not share this medicine with others. What if I miss a dose? Keep appointments for follow-up (booster) doses. It is important not to miss your dose. Call your health care provider if you are unable to keep an appointment. What may interact with this medication? medicines that suppress your immune system medicines to treat cancer steroid medicines like prednisone or cortisone This list may not describe all possible interactions. Give your health care provider a list of all the medicines, herbs, non-prescription drugs, or dietary supplements you use. Also tell them if you smoke, drink alcohol,  or use illegal drugs. Some items may interact with your medicine. What should I watch for while using this medication? Visit your health care provider regularly. This vaccine, like all vaccines, may not fully protect everyone. What side effects may I notice from receiving this medication? Side effects that you should report to your doctor or health care professional as soon as possible: allergic reactions (skin rash, itching or hives; swelling of the face, lips, or tongue) trouble breathing Side effects that usually do not require medical attention (report these to your doctor or health care professional if they continue or are bothersome): chills headache fever nausea pain, redness, or irritation at site where injected tiredness vomiting This list may not describe all possible side effects. Call your doctor for medical advice about side effects. You may report side effects to FDA at 1-800-FDA-1088. Where should I keep my medication? This vaccine is only given by a health care provider. It will not be stored at home. NOTE: This sheet is a summary. It may not cover all possible information. If you have questions about this medicine, talk to your doctor, pharmacist, or health care provider.  2023 Elsevier/Gold Standard (2021-04-13 00:00:00)

## 2022-01-25 NOTE — Progress Notes (Signed)
Chief Complaint  Patient presents with   Follow-up   F/u  1. Htn on norvasc 2.5 mg qd hyzaar 100-25 mg qd  Dm2 6.5 she started ozempic 0.25 but worried about side effects so stopped and not taking metformin 500 mg xr qd  She is trying lifestyle changes    Review of Systems  Constitutional:  Negative for weight loss.  HENT:  Negative for hearing loss.   Eyes:  Negative for blurred vision.  Respiratory:  Negative for shortness of breath.   Cardiovascular:  Negative for chest pain.  Gastrointestinal:  Negative for abdominal pain and blood in stool.  Genitourinary:  Negative for dysuria.  Musculoskeletal:  Negative for falls and joint pain.  Skin:  Negative for rash.  Neurological:  Negative for headaches.  Psychiatric/Behavioral:  Negative for depression.    Past Medical History:  Diagnosis Date   Allergic rhinitis    COVID-19    Hypertension    Vitamin D deficiency    Past Surgical History:  Procedure Laterality Date   CESAREAN SECTION     3   JOINT REPLACEMENT     hip raplacement (right)   Family History  Problem Relation Age of Onset   Stroke Mother    Hypertension Mother    Diabetes Mother    Hyperlipidemia Mother    CAD Mother        with stents x 2   Hypertension Father    Hyperlipidemia Father    Brain cancer Father    Sleep apnea Brother    Social History   Socioeconomic History   Marital status: Married    Spouse name: Not on file   Number of children: Not on file   Years of education: Not on file   Highest education level: Not on file  Occupational History   Not on file  Tobacco Use   Smoking status: Never   Smokeless tobacco: Never  Substance and Sexual Activity   Alcohol use: Yes    Alcohol/week: 1.0 standard drink of alcohol    Types: 1 Standard drinks or equivalent per week   Drug use: No   Sexual activity: Not Currently    Partners: Male  Other Topics Concern   Not on file  Social History Narrative   3 kids    Married to husband  53 dating since 66 college   Works with school    Husband [employer]    Social Determinants of Health   Financial Resource Strain: Not on file  Food Insecurity: Not on file  Transportation Needs: Not on file  Physical Activity: Not on file  Stress: Not on file  Social Connections: Not on file  Intimate Partner Violence: Not on file   Current Meds  Medication Sig   Zoster Vaccine Adjuvanted [facility]) injection Inject 0.5 mLs into the muscle once for 1 dose.   [DISCONTINUED] amLODipine (NORVASC) 2.5 MG tablet Take 1 tablet (2.5 mg total) by mouth daily.   [DISCONTINUED] losartan-hydrochlorothiazide (HYZAAR) 100-25 MG tablet Take 1 tablet by mouth daily. D/c 50-12.5 mg qd   No Known Allergies Recent Results (from the past 2160 hour(s))  Basic Metabolic Panel (BMET)     Status: Abnormal   Collection Time: 10/31/21  9:56 AM  Result Value Ref Range   Sodium 137 135 - 145 mEq/L   Potassium 4.0 3.5 - 5.1 mEq/L   Chloride 102 96 - 112 mEq/L   CO2 25 19 - 32 mEq/L   Glucose, Bld 109 (H)  70 - 99 mg/dL   BUN 19 6 - 23 mg/dL   Creatinine, Ser 0.72 0.40 - 1.20 mg/dL   GFR 94.94 >60.00 mL/min    Comment: Calculated using the CKD-EPI Creatinine Equation (2021)   Calcium 9.6 8.4 - 10.5 mg/dL  HM DIABETES EYE EXAM     Status: None   Collection Time: 12/13/21 12:00 AM  Result Value Ref Range   HM Diabetic Eye Exam    HM COLONOSCOPY     Status: None   Collection Time: 12/31/21 12:00 AM  Result Value Ref Range   HM Colonoscopy See Report (in chart) See Report (in chart), Patient Reported    Comment: [facility] Dr. Earlie Counts 12/31/21 f/u 3 years hyper,tubular, lymphoid  HM DIABETES EYE EXAM     Status: None   Collection Time: 01/03/22 12:00 AM  Result Value Ref Range   HM Diabetic Eye Exam No Retinopathy No Retinopathy    Comment: [facility] Dr.Hampton    Objective  Body mass index is 33.13 kg/m. Wt Readings from Last 3 Encounters:  01/25/22 187 lb (84.8 kg)  10/31/21 187 lb 12.8 oz (85.2 kg)   07/26/21 189 lb 9.6 oz (86 kg)   Temp Readings from Last 3 Encounters:  01/25/22 98.6 F (37 C) (Oral)  10/31/21 98.4 F (36.9 C) (Oral)  07/26/21 98.8 F (37.1 C) (Oral)   BP Readings from Last 3 Encounters:  01/25/22 118/86  10/31/21 140/80  07/26/21 136/86   Pulse Readings from Last 3 Encounters:  01/25/22 87  10/31/21 85  07/26/21 98    Physical Exam Vitals and nursing note reviewed.  Constitutional:      Appearance: Normal appearance. She is well-developed and well-groomed.  HENT:     Head: Normocephalic and atraumatic.  Eyes:     Conjunctiva/sclera: Conjunctivae normal.     Pupils: Pupils are equal, round, and reactive to light.  Cardiovascular:     Rate and Rhythm: Normal rate and regular rhythm.     Heart sounds: Normal heart sounds. No murmur heard. Pulmonary:     Effort: Pulmonary effort is normal.     Breath sounds: Normal breath sounds.  Abdominal:     General: Abdomen is flat. Bowel sounds are normal.     Tenderness: There is no abdominal tenderness.  Musculoskeletal:        General: No tenderness.  Skin:    General: Skin is warm and dry.  Neurological:     General: No focal deficit present.     Mental Status: She is alert and oriented to person, place, and time. Mental status is at baseline.     Cranial Nerves: Cranial nerves 2-12 are intact.     Motor: Motor function is intact.     Coordination: Coordination is intact.     Gait: Gait is intact.  Psychiatric:        Attention and Perception: Attention and perception normal.        Mood and Affect: Mood and affect normal.        Speech: Speech normal.        Behavior: Behavior normal. Behavior is cooperative.        Thought Content: Thought content normal.        Cognition and Memory: Cognition and memory normal.        Judgment: Judgment normal.     Assessment  Plan  Hypertension bp improved associated with diabetes ([place]) 6.5- Plan: metFORMIN (GLUCOPHAGE-XR) 500 MG 24 hr tablet,  amLODipine (NORVASC)  2.5 MG tablet, losartan-hydrochlorothiazide (HYZAAR) 100-25 MG tablet  HM Fasting labs 07/20/21 Declines flu shot  covid 2/2 moderna consider booster  shingrix rx today will get HT   Tdap 11/12/19  Pap nl 03/08/19 Dr. Garwin Brothers appt 02/2022 Mammogram -she reports gets q 2 years with ob/gyn seeing since 1995  -03/07/21   Mammo,dexa ordered [facility] or [facility] Ordered dexa FH osteoporosis    Colonoscopy referral [facility] Dr. Earlie Counts 12/31/21 f/u 3 years hyper,tubular, lymphoid   rec healthy diet and exercise   On vitamin D 2000 IU qd     eye  Provider: Dr. Olivia Mackie McLean-Scocuzza-[facility]

## 2022-06-21 ENCOUNTER — Encounter: Payer: Self-pay | Admitting: Family

## 2022-06-21 ENCOUNTER — Ambulatory Visit (INDEPENDENT_AMBULATORY_CARE_PROVIDER_SITE_OTHER): Payer: Managed Care, Other (non HMO) | Admitting: Family

## 2022-06-21 VITALS — BP 136/80 | HR 85 | Temp 99.4°F | Ht 63.0 in | Wt 192.2 lb

## 2022-06-21 DIAGNOSIS — J029 Acute pharyngitis, unspecified: Secondary | ICD-10-CM

## 2022-06-21 LAB — POC COVID19 BINAXNOW: SARS Coronavirus 2 Ag: NEGATIVE

## 2022-06-21 LAB — POCT INFLUENZA A/B
Influenza A, POC: NEGATIVE
Influenza B, POC: NEGATIVE

## 2022-06-21 LAB — POCT RAPID STREP A (OFFICE): Rapid Strep A Screen: NEGATIVE

## 2022-06-21 NOTE — Assessment & Plan Note (Signed)
Afebrile.  No cough, body aches to suggest flu after suspected exposure 3 days ago.  Strep, flu and COVID are negative today.  Advised patient suspect pharyngitis is viral or allergic in etiology.  Discussed with her that viruses may be spread on her hands or droplets in the air.  Encouraged her to wear a mask if she would develop a cough.  Otherwise I feel comfortable advising patient that she may participate in weekend activities.  She will let us know if symptoms were to change

## 2022-06-21 NOTE — Progress Notes (Signed)
Assessment & Plan:  Sore throat -     POCT Influenza A/B -     POCT rapid strep A -     POC COVID-19 BinaxNow  Pharyngitis, unspecified etiology Assessment & Plan: Afebrile.  No cough, body aches to suggest flu after suspected exposure 3 days ago.  Strep, flu and COVID are negative today.  Advised patient suspect pharyngitis is viral or allergic in etiology.  Discussed with her that viruses may be spread on her hands or droplets in the air.  Encouraged her to wear a mask if she would develop a cough.  Otherwise I feel comfortable advising patient that she may participate in weekend activities.  She will let us know if symptoms were to change      Return precautions given.   Risks, benefits, and alternatives of the medications and treatment plan prescribed today were discussed, and patient expressed understanding.   Education regarding symptom management and diagnosis given to patient on AVS either electronically or printed.  No follow-ups on file.  Mable Paris, FNP  Subjective:    Patient ID: Morgan Gonzalez, female    DOB: 1965/10/05, 57 y.o.   MRN: [id number]  CC: Morgan Gonzalez is a 57 y.o. female who presents today for an acute visit.    HPI: Complains of sore throat x 1 day, unchanged. She describes as not 'super painful'.  She is a busy weekend ahead with school homecoming   No fever, sob, cough, sneezing.   She is not taking medication for this.   She works in [employer], engineering.   Contact with student with flu 3 days ago      Allergies: Patient has no known allergies. Current Outpatient Medications on File Prior to Visit  Medication Sig Dispense Refill   amLODipine (NORVASC) 2.5 MG tablet Take 1 tablet (2.5 mg total) by mouth daily. 90 tablet 3   losartan-hydrochlorothiazide (HYZAAR) 100-25 MG tablet Take 1 tablet by mouth daily. D/c 50-12.5 mg qd 90 tablet 3   metFORMIN (GLUCOPHAGE-XR) 500 MG 24 hr tablet Take 1 tablet (500 mg total) by mouth daily with  breakfast. 90 tablet 3   No current facility-administered medications on file prior to visit.    Review of Systems  Constitutional:  Negative for chills and fever.  HENT:  Positive for sore throat. Negative for congestion and sinus pain.   Respiratory:  Negative for cough and shortness of breath.   Cardiovascular:  Negative for chest pain and palpitations.  Gastrointestinal:  Negative for nausea and vomiting.      Objective:    BP 136/80   Pulse 85   Temp 99.4 F (37.4 C) (Oral)   Ht '5\' 3"'$  (1.6 m)   Wt 192 lb 3.2 oz (87.2 kg)   LMP  (LMP Unknown)   SpO2 97%   BMI 34.05 kg/m   BP Readings from Last 3 Encounters:  06/21/22 136/80  01/25/22 118/86  10/31/21 140/80   Wt Readings from Last 3 Encounters:  06/21/22 192 lb 3.2 oz (87.2 kg)  01/25/22 187 lb (84.8 kg)  10/31/21 187 lb 12.8 oz (85.2 kg)    Physical Exam Vitals reviewed.  Constitutional:      Appearance: She is well-developed.  HENT:     Head: Normocephalic and atraumatic.     Right Ear: Hearing, tympanic membrane, ear canal and external ear normal. No decreased hearing noted. No drainage, swelling or tenderness. No middle ear effusion. No foreign body. Tympanic membrane is not erythematous  or bulging.     Left Ear: Hearing, tympanic membrane, ear canal and external ear normal. No decreased hearing noted. No drainage, swelling or tenderness.  No middle ear effusion. No foreign body. Tympanic membrane is not erythematous or bulging.     Nose: Nose normal. No rhinorrhea.     Right Sinus: No maxillary sinus tenderness or frontal sinus tenderness.     Left Sinus: No maxillary sinus tenderness or frontal sinus tenderness.     Mouth/Throat:     Pharynx: Uvula midline. Posterior oropharyngeal erythema present. No oropharyngeal exudate.     Tonsils: No tonsillar abscesses.     Comments: Bilateral tonsils well behind pillars.  No white exudate Eyes:     Conjunctiva/sclera: Conjunctivae normal.  Cardiovascular:      Rate and Rhythm: Regular rhythm.     Pulses: Normal pulses.     Heart sounds: Normal heart sounds.  Pulmonary:     Effort: Pulmonary effort is normal.     Breath sounds: Normal breath sounds. No wheezing, rhonchi or rales.  Lymphadenopathy:     Head:     Right side of head: No submental, submandibular, tonsillar, preauricular, posterior auricular or occipital adenopathy.     Left side of head: No submental, submandibular, tonsillar, preauricular, posterior auricular or occipital adenopathy.     Cervical: No cervical adenopathy.  Skin:    General: Skin is warm and dry.  Neurological:     Mental Status: She is alert.  Psychiatric:        Speech: Speech normal.        Behavior: Behavior normal.        Thought Content: Thought content normal.

## 2022-07-02 ENCOUNTER — Ambulatory Visit: Payer: Managed Care, Other (non HMO) | Admitting: Family Medicine

## 2022-07-02 ENCOUNTER — Encounter: Payer: Self-pay | Admitting: Family Medicine

## 2022-07-02 VITALS — BP 122/82 | HR 92 | Temp 98.3°F | Ht 63.0 in | Wt 190.8 lb

## 2022-07-02 DIAGNOSIS — E1159 Type 2 diabetes mellitus with other circulatory complications: Secondary | ICD-10-CM

## 2022-07-02 DIAGNOSIS — E785 Hyperlipidemia, unspecified: Secondary | ICD-10-CM | POA: Diagnosis not present

## 2022-07-02 DIAGNOSIS — G4730 Sleep apnea, unspecified: Secondary | ICD-10-CM

## 2022-07-02 DIAGNOSIS — E119 Type 2 diabetes mellitus without complications: Secondary | ICD-10-CM

## 2022-07-02 DIAGNOSIS — E559 Vitamin D deficiency, unspecified: Secondary | ICD-10-CM

## 2022-07-02 DIAGNOSIS — Z1231 Encounter for screening mammogram for malignant neoplasm of breast: Secondary | ICD-10-CM | POA: Diagnosis not present

## 2022-07-02 DIAGNOSIS — K760 Fatty (change of) liver, not elsewhere classified: Secondary | ICD-10-CM

## 2022-07-02 DIAGNOSIS — Z6833 Body mass index (BMI) 33.0-33.9, adult: Secondary | ICD-10-CM

## 2022-07-02 DIAGNOSIS — I152 Hypertension secondary to endocrine disorders: Secondary | ICD-10-CM

## 2022-07-02 LAB — VITAMIN D 25 HYDROXY (VIT D DEFICIENCY, FRACTURES): VITD: 34.9 ng/mL (ref 30.00–100.00)

## 2022-07-02 LAB — VITAMIN B12: Vitamin B-12: 558 pg/mL (ref 211–911)

## 2022-07-02 NOTE — Progress Notes (Addendum)
SUBJECTIVE:   Chief Complaint  Patient presents with   Establish Care    Transfer of Care   HPI Patient presents to clinic to transfer care.  No acute concerns today.  Hypertension Asymptomatic.  Currently on Hyzaar 100-25 mg daily and Norvasc 2.5 mg daily.  Tolerating medication well.  Denies any visual changes, headaches, chest pain, palpitations, shortness of breath or lower extremity edema.  Diabetes Newly diagnosed 06/2021 with A1c 6.5.  Asymptomatic.  Currently on metformin SR 500 mg daily and tolerating medication well.   Currently on ACEi.  Was previously prescribed Ozempic and tried for 1 month.  Reports her husband requested that she not take the medication secondary to potential side effects.  Self discontinued medication.    Hyperlipidemia Not currently on statin therapy.  Most recent LDL 155, goal is less than 70  Requesting referral for sleep apnea evaluation Endorses has been reports that loud snoring at night.  Excessive fatigue during the day.    PERTINENT PMH / PSH: Hypertension Diabetes type 2 Obesity class I Hyperlipidemia   OBJECTIVE:  BP 122/82   Pulse 92   Temp 98.3 F (36.8 C)   Ht 5' 3"$  (1.6 m)   Wt 190 lb 12.8 oz (86.5 kg)   LMP  (LMP Unknown)   SpO2 98%   BMI 33.80 kg/m    Physical Exam Vitals reviewed.  Constitutional:      General: She is not in acute distress.    Appearance: Normal appearance. She is obese. She is not ill-appearing or toxic-appearing.  HENT:     Head: Normocephalic.  Eyes:     Conjunctiva/sclera: Conjunctivae normal.  Neck:     Thyroid: No thyromegaly or thyroid tenderness.  Cardiovascular:     Rate and Rhythm: Normal rate and regular rhythm.     Pulses: Normal pulses.  Pulmonary:     Effort: Pulmonary effort is normal.     Breath sounds: Normal breath sounds.  Abdominal:     General: Bowel sounds are normal.  Neurological:     Mental Status: She is alert. Mental status is at baseline.  Psychiatric:         Mood and Affect: Mood normal.        Behavior: Behavior normal.        Thought Content: Thought content normal.        Judgment: Judgment normal.     ASSESSMENT/PLAN:  Hypertension associated with diabetes ([place]) Assessment & Plan: Chronic.  Asymptomatic.  Tolerating current medications.  Well-controlled, goal less than 130/80 Continue Hyzaar 100-25 mg daily Continue Norvasc 2.5 mg daily Monitor blood pressure at home.  Goal less than 130/80 Labs today  Orders: -     CBC with Differential/Platelet; Future -     Comprehensive metabolic panel; Future -     TSH; Future  Diabetes mellitus without complication ([place]) -     Hemoglobin A1c; Future -     Vitamin B12 -     Microalbumin / creatinine urine ratio; Future  Hyperlipidemia, unspecified hyperlipidemia type Assessment & Plan: Chronic.  Not currently on statin therapy.  Increased risk factors include diabetes and hypertension and recent LDL 155.  Risk of coronary event increased. Not interested in statin therapy at this time. Repeat fasting lipids Discussed calcium score imaging, patient agreeable to continue with cardiac CT.  Pending results can revisit statin therapy    Orders: -     Lipid panel; Future -     CT CARDIAC  SCORING (SELF PAY ONLY); Future  Vitamin D deficiency -     VITAMIN D 25 Hydroxy (Vit-D Deficiency, Fractures)  Breast cancer screening by mammogram -     3D Screening Mammogram, Left and Right; Future  Sleep apnea in adult Assessment & Plan: High risk stop bang score Refer to pulmonology for sleep studies.  Orders: -     Ambulatory referral to Pulmonology  [facility] Colonoscopy 2023.  [facility] Dr. Earlie Counts 12/31/21 f/u 3 years, hyper,tubular, lymphoid polyps.  Due 12/2024 Mammogram referral today Declined shingles vaccine today Foot exam at next visit Ophthalmology exam for diabetic eye     PDMP reviewed  Return in about 6 months (around 12/31/2022) for PCP.  Carollee Leitz, MD

## 2022-07-02 NOTE — Patient Instructions (Signed)
It was a pleasure meeting you today. Thank you for allowing me to take part in your health care.  Our goals for today as we discussed include:  Referral sent for mammogram Referral sent for Mammogram. Please call to schedule appointment. [facility] [place] [address], [place] [address] [phone]   Referral sent for CT for calcium score.  This is a $99 self-pay imaging that determines risk for increased cardiac events.  Referral sent to pulmonology for sleep studies to evaluate for sleep apnea.  Will obtain labs today.  Please allow for 2 weeks to hear back from results.  Recommend Shingles vaccine.  This is a 2 dose series and can be given at your local pharmacy.  Please talk to your pharmacist about this.   Please schedule appointment in the near future for diabetic foot exam.  Follow-up in 6 months.  If you have any questions or concerns, please do not hesitate to call the office at [phone].  I look forward to our next visit and until then take care and stay safe.  Regards,   Carollee Leitz, MD   [facility]

## 2022-07-05 ENCOUNTER — Encounter: Payer: Self-pay | Admitting: Family Medicine

## 2022-07-05 DIAGNOSIS — Z1231 Encounter for screening mammogram for malignant neoplasm of breast: Secondary | ICD-10-CM | POA: Insufficient documentation

## 2022-07-05 DIAGNOSIS — G4730 Sleep apnea, unspecified: Secondary | ICD-10-CM | POA: Insufficient documentation

## 2022-07-05 NOTE — Assessment & Plan Note (Signed)
High risk stop bang score Refer to pulmonology for sleep studies.

## 2022-07-05 NOTE — Assessment & Plan Note (Signed)
Chronic.  Not currently on statin therapy.  Increased risk factors include diabetes and hypertension and recent LDL 155.  Risk of coronary event increased. Not interested in statin therapy at this time. Repeat fasting lipids Discussed calcium score imaging, patient agreeable to continue with cardiac CT.  Pending results can revisit statin therapy

## 2022-07-05 NOTE — Assessment & Plan Note (Signed)
Chronic.  Asymptomatic.  Tolerating current medications.  Well-controlled, goal less than 130/80 Continue Hyzaar 100-25 mg daily Continue Norvasc 2.5 mg daily Monitor blood pressure at home.  Goal less than 130/80 Labs today

## 2022-07-15 ENCOUNTER — Ambulatory Visit
Admission: RE | Admit: 2022-07-15 | Discharge: 2022-07-15 | Disposition: A | Discharge status: Home / Self Care | Payer: Managed Care, Other (non HMO) | Source: Ambulatory Visit | Attending: Family Medicine | Admitting: Family Medicine

## 2022-07-15 DIAGNOSIS — E785 Hyperlipidemia, unspecified: Secondary | ICD-10-CM | POA: Insufficient documentation

## 2022-08-14 ENCOUNTER — Institutional Professional Consult (permissible substitution): Payer: Managed Care, Other (non HMO) | Admitting: Internal Medicine

## 2022-09-10 ENCOUNTER — Ambulatory Visit
Admission: EM | Admit: 2022-09-10 | Discharge: 2022-09-10 | Disposition: A | Discharge status: Home / Self Care | Payer: Managed Care, Other (non HMO) | Attending: Emergency Medicine | Admitting: Emergency Medicine

## 2022-09-10 DIAGNOSIS — I10 Essential (primary) hypertension: Secondary | ICD-10-CM | POA: Diagnosis not present

## 2022-09-10 DIAGNOSIS — J0100 Acute maxillary sinusitis, unspecified: Secondary | ICD-10-CM | POA: Diagnosis not present

## 2022-09-10 MED ORDER — AMOXICILLIN 875 MG PO TABS: 875.0000 mg | ORAL_TABLET | Freq: Two times a day (BID) | ORAL | 0 refills | Status: AC

## 2022-09-10 NOTE — Discharge Instructions (Addendum)
Take the amoxicillin as directed for sinus infection.   Your blood pressure is elevated today at 154/109; repeat 142/98.  Please have this rechecked by your primary care provider in 2-4 weeks.

## 2022-09-10 NOTE — ED Triage Notes (Signed)
Patient presents to UC for cold like symptoms. Reports sore throat, congestion, productive cough x 12-14 days. Taking dayquil. Hx of seasonal allergies.

## 2022-09-10 NOTE — ED Provider Notes (Signed)
Renaldo Fiddler    CSN: [id number] Arrival date & time: 09/10/22  0854      History   Chief Complaint Chief Complaint  Patient presents with   Sore Throat    HPI Morgan Gonzalez is a 57 y.o. female.  Patient presents with 2-week history of congestion, postnasal drip, sore throat, cough.  Treatment attempted with DayQuil; no OTC medications taken today.  She denies fever, chills, rash, shortness of breath, or other symptoms.  Her medical history includes seasonal allergies and hypertension.  The history is provided by the patient and medical records.    Past Medical History:  Diagnosis Date   Allergic rhinitis    COVID-19    Hypertension    Hypertension    Pharyngitis 06/16/2015   Vitamin D deficiency     Patient Active Problem List   Diagnosis Date Noted   Breast cancer screening by mammogram 07/05/2022   Sleep apnea in adult 07/05/2022   BMI 33.0-33.9,adult 10/31/2021   Hypertension associated with diabetes 07/26/2021   Annual physical exam 11/12/2019   Fatty liver 11/12/2019   Elevated liver enzymes 11/12/2019   Hyperlipidemia 12/21/2018   Allergic rhinitis    Vitamin D deficiency    Obesity (BMI 30.0-34.9) 05/24/2015   Osteoarthritis of left hip 02/20/2015    Past Surgical History:  Procedure Laterality Date   CESAREAN SECTION     3   JOINT REPLACEMENT     hip raplacement (right)    OB History   No obstetric history on file.      Home Medications    Prior to Admission medications   Medication Sig Start Date End Date Taking? Authorizing Provider  amoxicillin (AMOXIL) 875 MG tablet Take 1 tablet (875 mg total) by mouth 2 (two) times daily for 10 days. 09/10/22 09/20/22 Yes Mickie Bail, NP  amLODipine (NORVASC) 2.5 MG tablet Take 1 tablet (2.5 mg total) by mouth daily. 01/25/22   McLean-Scocuzza, Pasty Spillers, MD  losartan-hydrochlorothiazide (HYZAAR) 100-25 MG tablet Take 1 tablet by mouth daily. D/c 50-12.5 mg qd 01/25/22   McLean-Scocuzza, Pasty Spillers, MD   metFORMIN (GLUCOPHAGE-XR) 500 MG 24 hr tablet Take 1 tablet (500 mg total) by mouth daily with breakfast. 01/25/22   McLean-Scocuzza, Pasty Spillers, MD    Family History Family History  Problem Relation Age of Onset   Stroke Mother    Hypertension Mother    Diabetes Mother    Hyperlipidemia Mother    CAD Mother        with stents x 2   Hypertension Father    Hyperlipidemia Father    Brain cancer Father    Sleep apnea Brother     Social History Social History   Tobacco Use   Smoking status: Never   Smokeless tobacco: Never  Substance Use Topics   Alcohol use: Yes    Alcohol/week: 1.0 standard drink of alcohol    Types: 1 Standard drinks or equivalent per week   Drug use: No     Allergies   Patient has no known allergies.   Review of Systems Review of Systems  Constitutional:  Negative for chills and fever.  HENT:  Positive for congestion, postnasal drip, rhinorrhea and sore throat. Negative for ear pain.   Respiratory:  Positive for cough. Negative for shortness of breath.   Cardiovascular:  Negative for chest pain and palpitations.  Gastrointestinal:  Negative for diarrhea and vomiting.  All other systems reviewed and are negative.    Physical Exam  Triage Vital Signs ED Triage Vitals  Enc Vitals Group     BP 09/10/22 0901 (!) 154/109     Pulse Rate 09/10/22 0901 96     Resp 09/10/22 0901 18     Temp 09/10/22 0901 98.7 F (37.1 C)     Temp src --      SpO2 09/10/22 0901 95 %     Weight --      Height --      Head Circumference --      Peak Flow --      Pain Score 09/10/22 0902 4     Pain Loc --      Pain Edu? --      Excl. in GC? --    No data found.  Updated Vital Signs BP (!) 142/98 (BP Location: Left Arm)   Pulse 96   Temp 98.7 F (37.1 C)   Resp 18   LMP  (LMP Unknown)   SpO2 95%   Visual Acuity Right Eye Distance:   Left Eye Distance:   Bilateral Distance:    Right Eye Near:   Left Eye Near:    Bilateral Near:     Physical  Exam Vitals and nursing note reviewed.  Constitutional:      General: She is not in acute distress.    Appearance: Normal appearance. She is well-developed. She is not ill-appearing.  HENT:     Right Ear: Tympanic membrane normal.     Left Ear: Tympanic membrane normal.     Nose: Congestion present.     Mouth/Throat:     Mouth: Mucous membranes are moist.     Pharynx: Oropharynx is clear.  Cardiovascular:     Rate and Rhythm: Normal rate and regular rhythm.     Heart sounds: Normal heart sounds.  Pulmonary:     Effort: Pulmonary effort is normal. No respiratory distress.     Breath sounds: Normal breath sounds.  Musculoskeletal:     Cervical back: Neck supple.  Skin:    General: Skin is warm and dry.  Neurological:     Mental Status: She is alert.  Psychiatric:        Mood and Affect: Mood normal.        Behavior: Behavior normal.      UC Treatments / Results  Labs (all labs ordered are listed, but only abnormal results are displayed) Labs Reviewed - No data to display  EKG   Radiology No results found.  Procedures Procedures (including critical care time)  Medications Ordered in UC Medications - No data to display  Initial Impression / Assessment and Plan / UC Course  I have reviewed the triage vital signs and the nursing notes.  Pertinent labs & imaging results that were available during my care of the patient were reviewed by me and considered in my medical decision making (see chart for details).    Acute sinusitis, Elevated blood pressure with HTN.  Patient has been symptomatic for 2 weeks and is not improving with OTC treatment.  Treatment today with amoxicillin.  Cautioned patient to avoid OTC medications that can elevate her blood pressure.  Discussed with patient that her blood pressure is elevated today and needs to be rechecked by PCP in 2 to 4 weeks.  Education provided on managing hypertension.  She agrees to plan of care.   Final Clinical  Impressions(s) / UC Diagnoses   Final diagnoses:  Acute non-recurrent maxillary sinusitis  Elevated blood pressure reading  in office with diagnosis of hypertension     Discharge Instructions      Take the amoxicillin as directed for sinus infection.   Your blood pressure is elevated today at 154/109; repeat 142/98.  Please have this rechecked by your primary care provider in 2-4 weeks.          ED Prescriptions     Medication Sig Dispense Auth. Provider   amoxicillin (AMOXIL) 875 MG tablet Take 1 tablet (875 mg total) by mouth 2 (two) times daily for 10 days. 20 tablet Mickie Bail, NP      PDMP not reviewed this encounter.   Mickie Bail, NP 09/10/22 [phone]

## 2022-10-16 ENCOUNTER — Ambulatory Visit (INDEPENDENT_AMBULATORY_CARE_PROVIDER_SITE_OTHER): Payer: Managed Care, Other (non HMO) | Admitting: Dermatology

## 2022-10-16 VITALS — BP 137/91 | HR 105

## 2022-10-16 DIAGNOSIS — L578 Other skin changes due to chronic exposure to nonionizing radiation: Secondary | ICD-10-CM

## 2022-10-16 DIAGNOSIS — D1801 Hemangioma of skin and subcutaneous tissue: Secondary | ICD-10-CM | POA: Diagnosis not present

## 2022-10-16 DIAGNOSIS — D229 Melanocytic nevi, unspecified: Secondary | ICD-10-CM

## 2022-10-16 DIAGNOSIS — L821 Other seborrheic keratosis: Secondary | ICD-10-CM

## 2022-10-16 DIAGNOSIS — Z1283 Encounter for screening for malignant neoplasm of skin: Secondary | ICD-10-CM

## 2022-10-16 DIAGNOSIS — L814 Other melanin hyperpigmentation: Secondary | ICD-10-CM

## 2022-10-16 DIAGNOSIS — W908XXA Exposure to other nonionizing radiation, initial encounter: Secondary | ICD-10-CM

## 2022-10-16 DIAGNOSIS — L988 Other specified disorders of the skin and subcutaneous tissue: Secondary | ICD-10-CM

## 2022-10-16 DIAGNOSIS — Z7189 Other specified counseling: Secondary | ICD-10-CM

## 2022-10-16 DIAGNOSIS — X32XXXA Exposure to sunlight, initial encounter: Secondary | ICD-10-CM

## 2022-10-16 NOTE — Progress Notes (Signed)
Follow-Up Visit   Subjective  Morgan Gonzalez is a 57 y.o. female who presents for the following: Skin Cancer Screening and Full Body Skin Exam  The patient presents for Total-Body Skin Exam (TBSE) for skin cancer screening and mole check. The patient has spots, moles and lesions to be evaluated, some may be new or changing and the patient has concerns that these could be cancer.    The following portions of the chart were reviewed this encounter and updated as appropriate: medications, allergies, medical history  Review of Systems:  No other skin or systemic complaints except as noted in HPI or Assessment and Plan.  Objective  Well appearing patient in no apparent distress; mood and affect are within normal limits.  A full examination was performed including scalp, head, eyes, ears, nose, lips, neck, chest, axillae, abdomen, back, buttocks, bilateral upper extremities, bilateral lower extremities, hands, feet, fingers, toes, fingernails, and toenails. All findings within normal limits unless otherwise noted below.   Relevant physical exam findings are noted in the Assessment and Plan.    Assessment & Plan   LENTIGINES, SEBORRHEIC KERATOSES, HEMANGIOMAS - Benign normal skin lesions - Benign-appearing - Call for any changes  MELANOCYTIC NEVI - Tan-brown and/or pink-flesh-colored symmetric macules and papules - Benign appearing on exam today - Observation - Call clinic for new or changing moles - Recommend daily use of broad spectrum spf 30+ sunscreen to sun-exposed areas.   ACTINIC DAMAGE - Chronic condition, secondary to cumulative UV/sun exposure - diffuse scaly erythematous macules with underlying dyspigmentation - Recommend daily broad spectrum sunscreen SPF 30+ to sun-exposed areas, reapply every 2 hours as needed.  - Staying in the shade or wearing long sleeves, sun glasses (UVA+UVB protection) and wide brim hats (4-inch brim around the entire circumference of the hat) are  also recommended for sun protection.  - Call for new or changing lesions.  FACIAL ELASTOSIS Exam: Rhytides and volume loss.  Treatment Plan: Recommend Alastin Restorative Complex either serum or cream - apply qd/bid qd  Also discussed   Basic OTC daily skin care regimen to prevent photoaging:   Recommend facial moisturizer with sunscreen SPF 30 every morning (OTC brands include CeraVe AM, Neutrogena, Eucerin, Cetaphil, Aveeno, La Roche Posay).  Can also apply a topical Vit C serum which is an antioxidant (OTC brands include CeraVe, La Roche Posay, and The Ordinary) underneath sunscreen in morning. If you are outside during the day in the summer for extended periods, especially swimming and/or sweating, make sure you apply a water resistant facial sunscreen lotion spf 30 or higher.   At night recommend a cream with retinol (a vitamin A derivative which stimulates collagen production) like CeraVe skin renewing retinol serum or ROC retinol correxion cream or Neutrogena rapid wrinkle repair cream. Retinol may cause skin irritation in people with sensitive skin.  Can use it every other day and/or apply on top of a hyaluronic acid (HA) moisturizer/serum (Neutrogena Hydroboost water cream) if better tolerated that way.  Retinol may also help with lightening brown spots.   Our office sells high quality, medically tested skin care lines such as Elta MD sunscreens (with Zinc), and Alastin skin care products, which are very effective in treating photoaging. The Alastin line includes cosmeceutical grade Vit.C serum, HA serum, Elastin stimulating moisturizers/serums, lightening serum, and sunscreens.  If you want prescription treatment, then you would need an appointment (Rx tretinoin and fade creams, Botox, filler injections, laser treatments, etc.) These prescriptions and procedures are not covered by  insurance but work very well.   Recommend daily broad spectrum sunscreen SPF 30+ to sun-exposed areas,  reapply every 2 hours as needed. Call for new or changing lesions.  Staying in the shade or wearing long sleeves, sun glasses (UVA+UVB protection) and wide brim hats (4-inch brim around the entire circumference of the hat) are also recommended for sun protection.    SKIN CANCER SCREENING PERFORMED TODAY.     Return in about 1 year (around 10/16/2023) for tbse.  IAsher Muir, CMA, am acting as scribe for Armida Sans, MD.   Documentation: I have reviewed the above documentation for accuracy and completeness, and I agree with the above.  Armida Sans, MD

## 2022-10-16 NOTE — Patient Instructions (Addendum)
Recommend either cream or serum form Alastin restorative skin complex once or twice daily   Basic OTC daily skin care regimen to prevent photoaging:   Recommend facial moisturizer with sunscreen SPF 30 every morning (OTC brands include CeraVe AM, Neutrogena, Eucerin, Cetaphil, Aveeno, La Roche Posay).  Can also apply a topical Vit C serum which is an antioxidant (OTC brands include CeraVe, La Roche Posay, and The Ordinary) underneath sunscreen in morning. If you are outside during the day in the summer for extended periods, especially swimming and/or sweating, make sure you apply a water resistant facial sunscreen lotion spf 30 or higher.   At night recommend a cream with retinol (a vitamin A derivative which stimulates collagen production) like CeraVe skin renewing retinol serum or ROC retinol correxion cream or Neutrogena rapid wrinkle repair cream. Retinol may cause skin irritation in people with sensitive skin.  Can use it every other day and/or apply on top of a hyaluronic acid (HA) moisturizer/serum (Neutrogena Hydroboost water cream) if better tolerated that way.  Retinol may also help with lightening brown spots.   Our office sells high quality, medically tested skin care lines such as Elta MD sunscreens (with Zinc), and Alastin skin care products, which are very effective in treating photoaging. The Alastin line includes cosmeceutical grade Vit.C serum, HA serum, Elastin stimulating moisturizers/serums, lightening serum, and sunscreens.  If you want prescription treatment, then you would need an appointment (Rx tretinoin and fade creams, Botox, filler injections, laser treatments, etc.) These prescriptions and procedures are not covered by insurance but work very well.     Melanoma ABCDEs  Melanoma is the most dangerous type of skin cancer, and is the leading cause of death from skin disease.  You are more likely to develop melanoma if you: Have light-colored skin, light-colored eyes, or  red or blond hair Spend a lot of time in the sun Tan regularly, either outdoors or in a tanning bed Have had blistering sunburns, especially during childhood Have a close family member who has had a melanoma Have atypical moles or large birthmarks  Early detection of melanoma is key since treatment is typically straightforward and cure rates are extremely high if we catch it early.   The first sign of melanoma is often a change in a mole or a new dark spot.  The ABCDE system is a way of remembering the signs of melanoma.  A for asymmetry:  The two halves do not match. B for border:  The edges of the growth are irregular. C for color:  A mixture of colors are present instead of an even brown color. D for diameter:  Melanomas are usually (but not always) greater than 6mm - the size of a pencil eraser. E for evolution:  The spot keeps changing in size, shape, and color.  Please check your skin once per month between visits. You can use a small mirror in front and a large mirror behind you to keep an eye on the back side or your body.   If you see any new or changing lesions before your next follow-up, please call to schedule a visit.  Please continue daily skin protection including broad spectrum sunscreen SPF 30+ to sun-exposed areas, reapplying every 2 hours as needed when you're outdoors.   Staying in the shade or wearing long sleeves, sun glasses (UVA+UVB protection) and wide brim hats (4-inch brim around the entire circumference of the hat) are also recommended for sun protection.    Due to recent  changes in healthcare laws, you may see results of your pathology and/or laboratory studies on MyChart before the doctors have had a chance to review them. We understand that in some cases there may be results that are confusing or concerning to you. Please understand that not all results are received at the same time and often the doctors may need to interpret multiple results in order to  provide you with the best plan of care or course of treatment. Therefore, we ask that you please give [place] 2 business days to thoroughly review all your results before contacting the office for clarification. Should we see a critical lab result, you will be contacted sooner.   If You Need Anything After Your Visit  If you have any questions or concerns for your doctor, please call our main line at [phone] and press option 4 to reach your doctor's medical assistant. If no one answers, please leave a voicemail as directed and we will return your call as soon as possible. Messages left after 4 pm will be answered the following business day.   You may also send [place] a message via MyChart. We typically respond to MyChart messages within 1-2 business days.  For prescription refills, please ask your pharmacy to contact our office. Our fax number is [phone].  If you have an urgent issue when the clinic is closed that cannot wait until the next business day, you can page your doctor at the number below.    Please note that while we do our best to be available for urgent issues outside of office hours, we are not available 24/7.   If you have an urgent issue and are unable to reach [place], you may choose to seek medical care at your doctor's office, retail clinic, urgent care center, or emergency room.  If you have a medical emergency, please immediately call 911 or go to the emergency department.  Pager Numbers  - Dr. Gwen Pounds: [phone]  - Dr. Neale Burly: [phone]  - Dr. Roseanne Reno: [phone]  In the event of inclement weather, please call our main line at [phone] for an update on the status of any delays or closures.  Dermatology Medication Tips: Please keep the boxes that topical medications come in in order to help keep track of the instructions about where and how to use these. Pharmacies typically print the medication instructions only on the boxes and not directly on the  medication tubes.   If your medication is too expensive, please contact our office at [phone] option 4 or send [place] a message through MyChart.   We are unable to tell what your co-pay for medications will be in advance as this is different depending on your insurance coverage. However, we may be able to find a substitute medication at lower cost or fill out paperwork to get insurance to cover a needed medication.   If a prior authorization is required to get your medication covered by your insurance company, please allow [place] 1-2 business days to complete this process.  Drug prices often vary depending on where the prescription is filled and some pharmacies may offer cheaper prices.  The website www.goodrx.com contains coupons for medications through different pharmacies. The prices here do not account for what the cost may be with help from insurance (it may be cheaper with your insurance), but the website can give you the price if you did not use any insurance.  - You can print the associated coupon and take it with your prescription  to the pharmacy.  - You may also stop by our office during regular business hours and pick up a GoodRx coupon card.  - If you need your prescription sent electronically to a different pharmacy, notify our office through [facility] or by phone at [phone] option 4.     Si Usted Necesita Algo Despus de Su Visita  Tambin puede enviarnos un mensaje a travs de Clinical cytogeneticist. Por lo general respondemos a los mensajes de MyChart en el transcurso de 1 a 2 das hbiles.  Para renovar recetas, por favor pida a su farmacia que se ponga en contacto con nuestra oficina. Annie Sable de fax es [place] [phone].  Si tiene un asunto urgente cuando la clnica est cerrada y que no puede esperar hasta el siguiente da hbil, puede llamar/localizar a su doctor(a) al nmero que aparece a continuacin.   Por favor, tenga en cuenta que aunque hacemos todo lo posible  para estar disponibles para asuntos urgentes fuera del horario de [place], no estamos disponibles las 24 horas del da, los 7 [address]  [address] de la [place].   Si tiene un problema urgente y no puede comunicarse con nosotros, puede optar por buscar atencin mdica  en el consultorio de su doctor(a), en una clnica privada, en un centro de atencin urgente o en una sala de emergencias.  Si tiene Engineer, drilling, por favor llame inmediatamente al 911 o vaya a la sala de emergencias.  Nmeros de bper  - Dr. Gwen Pounds: [phone]  - Dra. Moye: [phone]  - Dra. Roseanne Reno: [phone]  En caso de inclemencias del [place], por favor llame a Lacy Duverney principal al [phone] para una actualizacin sobre el [place] de cualquier retraso o cierre.  Consejos para la medicacin en dermatologa: Por favor, guarde las cajas en las que vienen los medicamentos de uso tpico para ayudarle a seguir las instrucciones sobre dnde y cmo usarlos. Las farmacias generalmente imprimen las instrucciones del medicamento slo en las cajas y no directamente en los tubos del [place].   Si su medicamento es muy caro, por favor, pngase en contacto con Rolm Gala llamando al [phone] y presione la opcin 4 o envenos un mensaje a travs de Clinical cytogeneticist.   No podemos decirle cul ser su copago por los medicamentos por adelantado ya que esto es diferente dependiendo de la cobertura de su seguro. Sin embargo, es posible que podamos encontrar un medicamento sustituto a Audiological scientist un formulario para que el seguro cubra el medicamento que se considera necesario.   Si se requiere una autorizacin previa para que su compaa de seguros [place] su medicamento, por favor permtanos de 1 a 2 das hbiles para completar [address]  Los precios de los medicamentos varan con frecuencia dependiendo del Environmental consultant de dnde se surte la receta y alguna farmacias pueden ofrecer precios ms baratos.  El sitio web  www.goodrx.com tiene cupones para medicamentos de Health and safety inspector. Los precios aqu no tienen en cuenta lo que podra costar con la ayuda del seguro (puede ser ms barato con su seguro), pero el sitio web puede darle el precio si no utiliz Tourist information centre manager.  - Puede imprimir el cupn correspondiente y llevarlo con su receta a la farmacia.  - Tambin puede pasar por nuestra oficina durante el horario de atencin regular y Education officer, museum una tarjeta de cupones de GoodRx.  - Si necesita que su receta se enve electrnicamente a Psychiatrist, informe a nuestra oficina a travs de MyChart de [employer] o por  telfono llamando al [phone] y presione la opcin 4.

## 2022-10-25 ENCOUNTER — Encounter: Payer: Self-pay | Admitting: Dermatology

## 2022-11-05 ENCOUNTER — Ambulatory Visit (INDEPENDENT_AMBULATORY_CARE_PROVIDER_SITE_OTHER): Payer: Managed Care, Other (non HMO) | Admitting: Internal Medicine

## 2022-11-05 ENCOUNTER — Encounter: Payer: Self-pay | Admitting: Internal Medicine

## 2022-11-05 VITALS — BP 124/80 | HR 102 | Temp 97.1°F | Ht 63.0 in | Wt 193.8 lb

## 2022-11-05 DIAGNOSIS — G4719 Other hypersomnia: Secondary | ICD-10-CM | POA: Diagnosis not present

## 2022-11-05 NOTE — Progress Notes (Signed)
Name: Emauri Foxall MRN: [id number] DOB: [birth year]    CHIEF COMPLAINT:  EXCESSIVE DAYTIME SLEEPINESS   HISTORY OF PRESENT ILLNESS: Patient is seen today for problems and issues with sleep related to excessive daytime sleepiness Patient  has been having sleep problems for many years Patient has been having excessive daytime sleepiness for a long time Patient has been having extreme fatigue and tiredness, lack of energy +  very Loud snoring every night + struggling breathe at night and gasps for air + morning headaches + Nonrefreshing sleep  Discussed sleep data and reviewed with patient.  Encouraged proper weight management.  Discussed driving precautions and its relationship with hypersomnolence.  Discussed operating dangerous equipment and its relationship with hypersomnolence.  Discussed sleep hygiene, and benefits of a fixed sleep waked time.  The importance of getting eight or more hours of sleep discussed with patient.  Discussed limiting the use of the computer and television before bedtime.  Decrease naps during the day, so night time sleep will become enhanced.  Limit caffeine, and sleep deprivation.  HTN, stroke, and heart failure are potential risk factors.    EPWORTH SLEEP SCORE 14    PAST MEDICAL HISTORY :   has a past medical history of Allergic rhinitis, COVID-19, Hypertension, Hypertension, Pharyngitis (06/16/2015), and Vitamin D deficiency.  has a past surgical history that includes Cesarean section and Joint replacement. Prior to Admission medications   Medication Sig Start Date End Date Taking? Authorizing Provider  amLODipine (NORVASC) 2.5 MG tablet Take 1 tablet (2.5 mg total) by mouth daily. 01/25/22   McLean-Scocuzza, Pasty Spillers, MD  losartan-hydrochlorothiazide (HYZAAR) 100-25 MG tablet Take 1 tablet by mouth daily. D/c 50-12.5 mg qd 01/25/22   McLean-Scocuzza, Pasty Spillers, MD  metFORMIN (GLUCOPHAGE-XR) 500 MG 24 hr tablet Take 1 tablet (500 mg total) by mouth  daily with breakfast. 01/25/22   McLean-Scocuzza, Pasty Spillers, MD   No Known Allergies  FAMILY HISTORY:  family history includes Brain cancer in her father; CAD in her mother; Diabetes in her mother; Hyperlipidemia in her father and mother; Hypertension in her father and mother; Sleep apnea in her brother; Stroke in her mother. SOCIAL HISTORY:  reports that she has never smoked. She has never used smokeless tobacco. She reports current alcohol use of about 1.0 standard drink of alcohol per week. She reports that she does not use drugs.   Review of Systems:  Gen:  Denies  fever, sweats, chills weight loss  HEENT: Denies blurred vision, double vision, ear pain, eye pain, hearing loss, nose bleeds, sore throat Cardiac:  No dizziness, chest pain or heaviness, chest tightness,edema, No JVD Resp:   No cough, -sputum production, -shortness of breath,-wheezing, -hemoptysis,  Gi: Denies swallowing difficulty, stomach pain, nausea or vomiting, diarrhea, constipation, bowel incontinence Gu:  Denies bladder incontinence, burning urine Ext:   Denies Joint pain, stiffness or swelling Skin: Denies  skin rash, easy bruising or bleeding or hives Endoc:  Denies polyuria, polydipsia , polyphagia or weight change Psych:   Denies depression, insomnia or hallucinations  Other:  All other systems negative   ALL OTHER ROS ARE NEGATIVE   BP 124/80 (BP Location: Left Arm, Cuff Size: Normal)   Pulse (!) 102   Temp (!) 97.1 F (36.2 C)   Ht 5\' 3"  (1.6 m)   Wt 193 lb 12.8 oz (87.9 kg)   LMP  (LMP Unknown)   SpO2 92%   BMI 34.33 kg/m     Physical Examination:   General Appearance:  No distress  EYES PERRLA, EOM intact.   NECK Supple, No JVD Pulmonary: normal breath sounds, No wheezing.  CardiovascularNormal S1,S2.  No m/r/g.   Abdomen: Benign, Soft, non-tender. Skin:   warm, no rashes, no ecchymosis  Extremities: normal, no cyanosis, clubbing. Neuro:without focal findings,  speech normal  PSYCHIATRIC:  Mood, affect within normal limits.   ALL OTHER ROS ARE NEGATIVE    ASSESSMENT AND PLAN SYNOPSIS  Patient with signs and symptoms of excessive daytime sleepiness with probable underlying diagnosis of obstructive sleep apnea in the setting of obesity and deconditioned state   Recommend Sleep Study for definitve diagnosis  Obesity -recommend significant weight loss -recommend changing diet  Deconditioned state -Recommend increased daily activity and exercise   MEDICATION ADJUSTMENTS/LABS AND TESTS ORDERED: Recommend Sleep Study Recommend weight loss   CURRENT MEDICATIONS REVIEWED AT LENGTH WITH PATIENT TODAY   Patient  satisfied with Plan of action and management. All questions answered  Follow up  3 months  Total Time Spent  45 mins   Wallis Bamberg Santiago Glad, M.D.  [facility]  Medical Director [facility] Medical Director [facility] Cardio-Pulmonary Department

## 2022-11-05 NOTE — Patient Instructions (Signed)
RECOMMEND HOME SLEEP STUDY TO ASSESS FOR SLEEP APNEA  RECOMMEND WEIGHT LOSS

## 2022-11-12 ENCOUNTER — Telehealth: Payer: Self-pay | Admitting: Family Medicine

## 2022-11-12 ENCOUNTER — Other Ambulatory Visit: Payer: Self-pay

## 2022-11-12 DIAGNOSIS — I10 Essential (primary) hypertension: Secondary | ICD-10-CM

## 2022-11-12 DIAGNOSIS — E1159 Type 2 diabetes mellitus with other circulatory complications: Secondary | ICD-10-CM

## 2022-11-12 MED ORDER — LOSARTAN POTASSIUM-HCTZ 100-25 MG PO TABS: 1.0000 | ORAL_TABLET | Freq: Every day | ORAL | 3 refills | Status: DC

## 2022-11-12 MED ORDER — AMLODIPINE BESYLATE 2.5 MG PO TABS: 2.5000 mg | ORAL_TABLET | Freq: Every day | ORAL | 3 refills | Status: DC

## 2022-11-12 MED ORDER — METFORMIN HCL ER 500 MG PO TB24: 500.0000 mg | ORAL_TABLET | Freq: Every day | ORAL | 3 refills | Status: DC

## 2022-11-12 NOTE — Telephone Encounter (Signed)
Prescriptions sent to [facility].  

## 2022-11-12 NOTE — Telephone Encounter (Signed)
Prescription Request  11/12/2022  LOV: 07/02/2022  What is the name of the medication or equipment? metFORMIN (GLUCOPHAGE-XR) 500 MG 24 hr tablet, losartan-hydrochlorothiazide (HYZAAR) 100-25 MG tablet and amLODipine (NORVASC) 2.5 MG tablet  Have you contacted your pharmacy to request a refill? Yes   Which pharmacy would you like this sent to?   [id number] [address], [place] - [address] [place] [address] Phone: [phone] Fax: [phone]    Patient notified that their request is being sent to the clinical staff for review and that they should receive a response within 2 business days.   Please advise at Mobile [phone] (mobile)

## 2022-12-17 LAB — HM DIABETES EYE EXAM

## 2022-12-23 ENCOUNTER — Encounter: Payer: Self-pay | Admitting: Family Medicine

## 2022-12-31 ENCOUNTER — Ambulatory Visit: Payer: Managed Care, Other (non HMO) | Admitting: Family Medicine

## 2023-01-23 ENCOUNTER — Ambulatory Visit: Payer: Managed Care, Other (non HMO) | Admitting: Internal Medicine

## 2023-02-10 DIAGNOSIS — G4733 Obstructive sleep apnea (adult) (pediatric): Secondary | ICD-10-CM | POA: Diagnosis not present

## 2023-02-10 DIAGNOSIS — G4719 Other hypersomnia: Secondary | ICD-10-CM

## 2023-03-04 ENCOUNTER — Telehealth: Payer: Self-pay | Admitting: Internal Medicine

## 2023-03-04 NOTE — Telephone Encounter (Signed)
HST results

## 2023-03-04 NOTE — Telephone Encounter (Signed)
Called and spoke to patient.  She is requesting HST results.   Dr. Belia Heman, please advise. Thanks

## 2023-03-04 NOTE — Telephone Encounter (Signed)
Spoke to patient. She stated that she would call back to discuss results, as she is currently busy. Will await call back.

## 2023-03-10 NOTE — Telephone Encounter (Signed)
Lm for patient.

## 2023-03-11 NOTE — Telephone Encounter (Signed)
Spoke to patient and relayed below message/recommendations. She would like to discuss this further at upcoming appt.  Nothing further needed.

## 2023-03-20 ENCOUNTER — Ambulatory Visit: Payer: Managed Care, Other (non HMO) | Admitting: Dermatology

## 2023-04-04 ENCOUNTER — Telehealth: Payer: Self-pay | Admitting: Family Medicine

## 2023-04-04 NOTE — Telephone Encounter (Signed)
Patient called to state she spoke with Access Nurse and they asked her to call us to schedule an appointment within the next three days.  I scheduled an appointment for patient to see Dr. Dana Allan on 04/07/2023.

## 2023-04-04 NOTE — Telephone Encounter (Signed)
Noted pt is scheduled to see pcp on Monday

## 2023-04-04 NOTE — Telephone Encounter (Signed)
Patient called and stated she had a bowel movement this morning and it had a lot of blood in it, never happened before. Patient was sent to Access Nurse line to be triaged.

## 2023-04-04 NOTE — Telephone Encounter (Signed)
  Spoke to pt and advised her if symptoms get worse over the weekend to go to [facility] or ED.

## 2023-04-05 NOTE — Progress Notes (Unsigned)
   SUBJECTIVE:  No chief complaint on file.  HPI Presents for acute visit  Blood in stool Colonoscopy at [facility] 12/27/2021 Tubular adenoma, f/u in 3 years  NSAIDS?? No asa or antiplatelets EtOH??  PERTINENT PMH / PSH: As above  OBJECTIVE:  LMP  (LMP Unknown)    Physical Exam     07/02/2022   11:13 AM 06/21/2022   11:15 AM 01/25/2022   11:19 AM 10/31/2021    9:24 AM 07/26/2021    2:02 PM  Depression screen PHQ 2/9  Decreased Interest 0 0 0 0 0  Down, Depressed, Hopeless 0 0 0 0 0  PHQ - 2 Score 0 0 0 0 0  Altered sleeping 1 1     Tired, decreased energy 0 0     Change in appetite 1 1     Feeling bad or failure about yourself  0 0     Trouble concentrating 0 0     Moving slowly or fidgety/restless 0 0     Suicidal thoughts 0 0     PHQ-9 Score 2 2     Difficult doing work/chores Not difficult at all Not difficult at all         07/02/2022   11:14 AM 06/21/2022   11:15 AM 11/12/2019   11:57 AM  GAD 7 : Generalized Anxiety Score  Nervous, Anxious, on Edge 0 0 0  Control/stop worrying 0 0 0  Worry too much - different things 0 0 0  Trouble relaxing 0 0 0  Restless 0 0 0  Easily annoyed or irritable 0 0 0  Afraid - awful might happen 0 0 0  Total GAD 7 Score 0 0 0  Anxiety Difficulty Not difficult at all Not difficult at all Not difficult at all    ASSESSMENT/PLAN:  Hematochezia   PDMP reviewed***  No follow-ups on file.  Dana Allan, MD

## 2023-04-07 ENCOUNTER — Encounter: Payer: Self-pay | Admitting: Family Medicine

## 2023-04-07 ENCOUNTER — Ambulatory Visit (INDEPENDENT_AMBULATORY_CARE_PROVIDER_SITE_OTHER): Payer: Managed Care, Other (non HMO) | Admitting: Family Medicine

## 2023-04-07 ENCOUNTER — Encounter: Payer: Self-pay | Admitting: Internal Medicine

## 2023-04-07 ENCOUNTER — Ambulatory Visit (INDEPENDENT_AMBULATORY_CARE_PROVIDER_SITE_OTHER): Payer: Managed Care, Other (non HMO) | Admitting: Internal Medicine

## 2023-04-07 VITALS — BP 116/80 | HR 92 | Temp 97.6°F | Ht 63.0 in | Wt 193.0 lb

## 2023-04-07 VITALS — BP 132/80 | HR 84 | Temp 98.2°F | Resp 16 | Ht 63.0 in | Wt 192.0 lb

## 2023-04-07 DIAGNOSIS — G4733 Obstructive sleep apnea (adult) (pediatric): Secondary | ICD-10-CM

## 2023-04-07 DIAGNOSIS — R0982 Postnasal drip: Secondary | ICD-10-CM | POA: Diagnosis not present

## 2023-04-07 DIAGNOSIS — E1169 Type 2 diabetes mellitus with other specified complication: Secondary | ICD-10-CM | POA: Diagnosis not present

## 2023-04-07 DIAGNOSIS — K921 Melena: Secondary | ICD-10-CM | POA: Diagnosis not present

## 2023-04-07 DIAGNOSIS — E118 Type 2 diabetes mellitus with unspecified complications: Secondary | ICD-10-CM | POA: Diagnosis not present

## 2023-04-07 DIAGNOSIS — E1159 Type 2 diabetes mellitus with other circulatory complications: Secondary | ICD-10-CM

## 2023-04-07 DIAGNOSIS — Z7984 Long term (current) use of oral hypoglycemic drugs: Secondary | ICD-10-CM

## 2023-04-07 DIAGNOSIS — E785 Hyperlipidemia, unspecified: Secondary | ICD-10-CM

## 2023-04-07 DIAGNOSIS — I152 Hypertension secondary to endocrine disorders: Secondary | ICD-10-CM

## 2023-04-07 LAB — COMPREHENSIVE METABOLIC PANEL
ALT: 41 U/L — ABNORMAL HIGH (ref 0–35)
AST: 18 U/L (ref 0–37)
Albumin: 4.3 g/dL (ref 3.5–5.2)
Alkaline Phosphatase: 53 U/L (ref 39–117)
BUN: 21 mg/dL (ref 6–23)
CO2: 27 meq/L (ref 19–32)
Calcium: 9.3 mg/dL (ref 8.4–10.5)
Chloride: 102 meq/L (ref 96–112)
Creatinine, Ser: 0.78 mg/dL (ref 0.40–1.20)
GFR: 85.38 mL/min (ref >60.00)
Glucose, Bld: 121 mg/dL — ABNORMAL HIGH (ref 70–99)
Potassium: 3.7 meq/L (ref 3.5–5.1)
Sodium: 138 meq/L (ref 135–145)
Total Bilirubin: 0.4 mg/dL (ref 0.2–1.2)
Total Protein: 7.6 g/dL (ref 6.0–8.3)

## 2023-04-07 LAB — CBC WITH DIFFERENTIAL/PLATELET
Basophils Absolute: 0 10*3/uL (ref 0.0–0.1)
Basophils Relative: 0.6 % (ref 0.0–3.0)
Eosinophils Absolute: 0.1 10*3/uL (ref 0.0–0.7)
Eosinophils Relative: 0.9 % (ref 0.0–5.0)
HCT: 42.2 % (ref 36.0–46.0)
Hemoglobin: 14.3 g/dL (ref 12.0–15.0)
Lymphocytes Relative: 27.5 % (ref 12.0–46.0)
Lymphs Abs: 2.2 10*3/uL (ref 0.7–4.0)
MCHC: 33.9 g/dL (ref 30.0–36.0)
MCV: 89.5 fL (ref 78.0–100.0)
Monocytes Absolute: 0.6 10*3/uL (ref 0.1–1.0)
Monocytes Relative: 7.1 % (ref 3.0–12.0)
Neutro Abs: 5.1 10*3/uL (ref 1.4–7.7)
Neutrophils Relative %: 63.9 % (ref 43.0–77.0)
Platelets: 278 10*3/uL (ref 150.0–400.0)
RBC: 4.72 Mil/uL (ref 3.87–5.11)
RDW: 13 % (ref 11.5–15.5)
WBC: 8 10*3/uL (ref 4.0–10.5)

## 2023-04-07 LAB — LIPID PANEL
Cholesterol: 221 mg/dL — ABNORMAL HIGH (ref 0–200)
HDL: 45.4 mg/dL (ref >39.00)
LDL Cholesterol: 120 mg/dL — ABNORMAL HIGH (ref 0–99)
NonHDL: 175.23
Total CHOL/HDL Ratio: 5
Triglycerides: 275 mg/dL — ABNORMAL HIGH (ref 0.0–149.0)
VLDL: 55 mg/dL — ABNORMAL HIGH (ref 0.0–40.0)

## 2023-04-07 LAB — MICROALBUMIN / CREATININE URINE RATIO
Creatinine,U: 66.6 mg/dL
Microalb Creat Ratio: 1.1 mg/g (ref 0.0–30.0)
Microalb, Ur: <0.7 mg/dL (ref 0.0–1.9)

## 2023-04-07 LAB — HEMOGLOBIN A1C: Hgb A1c MFr Bld: 6.8 % — ABNORMAL HIGH (ref 4.6–6.5)

## 2023-04-07 MED ORDER — FLUTICASONE PROPIONATE 50 MCG/ACT NA SUSP: 2.0000 | Freq: Every day | NASAL | 6 refills | Status: AC

## 2023-04-07 NOTE — Patient Instructions (Addendum)
It was a pleasure meeting you today. Thank you for allowing me to take part in your health care.  Our goals for today as we discussed include:  We will get some labs today.  If they are abnormal or we need to do something about them, I will call you.  If they are normal, I will send you a message on MyChart (if it is active) or a letter in the mail.  If you don't hear from [place] in 2 weeks, please call the office at the number below.   Follow up with Pulmonology for sleep apnea  Start Flonase 2 spray at night for post nasal drainage If no improvement in 3-4 weeks can send referral to ENT   This is a list of the screening recommended for you and due dates:  Health Maintenance  Topic Date Due   Complete foot exam   Never done   HIV Screening  Never done   Yearly kidney health urinalysis for diabetes  Never done   Zoster (Shingles) Vaccine (1 of 2) Never done   COVID-19 Vaccine (3 - Moderna risk series) 10/02/2019   Mammogram  03/07/2021   Hemoglobin A1C  01/17/2022   Pap with HPV screening  03/07/2022   Yearly kidney function blood test for diabetes  11/01/2022   Flu Shot  08/25/2023*   Eye exam for diabetics  12/17/2023   Colon Cancer Screening  12/31/2024   DTaP/Tdap/Td vaccine (2 - Td or Tdap) 11/11/2029   Hepatitis C Screening  Completed   HPV Vaccine  Aged Out  *Topic was postponed. The date shown is not the original due date.     Follow up for annual foot exam  PAP due.  Please schedule appointment at your earliest convenience  If you have any questions or concerns, please do not hesitate to call the office at [phone].  I look forward to our next visit and until then take care and stay safe.  Regards,   Dana Allan, MD   [facility]

## 2023-04-07 NOTE — Assessment & Plan Note (Signed)
Single episode of bright red blood on wiping after bowel movement. No prior history. No associated symptoms such as abdominal pain, dizziness, or weakness. Possible etiologies include hemorrhoids or anal fissure, particularly given the history of straining. -Order stool guaiac test to check for occult blood. -Order blood work to check for anemia. -Consider referral to Gastroenterology based on test results.

## 2023-04-07 NOTE — Addendum Note (Signed)
Addended by: Warden Fillers on: 04/07/2023 09:55 AM   Modules accepted: Orders

## 2023-04-07 NOTE — Assessment & Plan Note (Signed)
Chronic symptoms suggestive of postnasal drip, possibly contributing to sleep disturbances and chest discomfort. History of snoring and mild sleep apnea. -Start Flonase, two sprays at night. -Follow-up with Pulmonology for sleep apnea evaluation. -Consider ENT referral if symptoms persist despite Flonase and sleep apnea management.

## 2023-04-07 NOTE — Assessment & Plan Note (Signed)
Well controlled on current medication -Check A1c, UACR today -Continue Metformin XR 500 mg daily -Schedule Foot exam

## 2023-04-07 NOTE — Progress Notes (Signed)
Name: Leonor Mcneff MRN: [id number] DOB: [birth year]    CHIEF COMPLAINT:  Diagnosis of obstructive sleep apnea assessment Home sleep test July 2024 AHI of 17 snoring episodes 366/h   HISTORY OF PRESENT ILLNESS: Patient seen and evaluated today for follow-up assessment for obstructive sleep apnea Sleep study reviewed in detail with patient AHI of 17 Snoring episodes 366/h  Patient has extreme fatigue Lack of energy Feels exhausted throughout the day Has morning headaches with nasal congestion  Discussed sleep data and reviewed with patient.  Encouraged proper weight management.  Discussed driving precautions and its relationship with hypersomnolence.  Discussed operating dangerous equipment and its relationship with hypersomnolence.  Discussed sleep hygiene, and benefits of a fixed sleep waked time.  The importance of getting eight or more hours of sleep discussed with patient.  Discussed limiting the use of the computer and television before bedtime.  Decrease naps during the day, so night time sleep will become enhanced.  Limit caffeine, and sleep deprivation.  HTN, stroke, and heart failure are potential risk factors.   Discussed risk of untreated sleep apnea including cardiac arrhthymias, stroke, DM, pulm HTN.   We discussed several sleep apnea therapy options 1 option we discussed was using a dental device however we will need repeat study to confirm if dental device is working  The traditional option is to start CPAP therapy with the machine and hose and nasal pillows I suggested we try auto CPAP 5-10   No exacerbation at this time No evidence of heart failure at this time No evidence or signs of infection at this time No respiratory distress No fevers, chills, nausea, vomiting, diarrhea No evidence of lower extremity edema No evidence hemoptysis   PAST MEDICAL HISTORY :   has a past medical history of Allergic rhinitis, COVID-19, Hypertension, Hypertension,  Pharyngitis (06/16/2015), and Vitamin D deficiency.  has a past surgical history that includes Cesarean section and Joint replacement. Prior to Admission medications   Medication Sig Start Date End Date Taking? Authorizing Provider  amLODipine (NORVASC) 2.5 MG tablet Take 1 tablet (2.5 mg total) by mouth daily. 01/25/22   McLean-Scocuzza, Pasty Spillers, MD  losartan-hydrochlorothiazide (HYZAAR) 100-25 MG tablet Take 1 tablet by mouth daily. D/c 50-12.5 mg qd 01/25/22   McLean-Scocuzza, Pasty Spillers, MD  metFORMIN (GLUCOPHAGE-XR) 500 MG 24 hr tablet Take 1 tablet (500 mg total) by mouth daily with breakfast. 01/25/22   McLean-Scocuzza, Pasty Spillers, MD   No Known Allergies  FAMILY HISTORY:  family history includes Brain cancer in her father; CAD in her mother; Diabetes in her mother; Hyperlipidemia in her father and mother; Hypertension in her father and mother; Sleep apnea in her brother; Stroke in her mother. SOCIAL HISTORY:  reports that she has never smoked. She has never used smokeless tobacco. She reports current alcohol use of about 1.0 standard drink of alcohol per week. She reports that she does not use drugs.  BP 116/80 (BP Location: Left Arm, Patient Position: Sitting, Cuff Size: Normal)   Pulse 92   Temp 97.6 F (36.4 C) (Temporal)   Ht 5\' 3"  (1.6 m)   Wt 193 lb (87.5 kg)   LMP  (LMP Unknown)   SpO2 97%   BMI 34.19 kg/m      Review of Systems: Gen:  Denies  fever, sweats, chills weight loss  HEENT: Denies blurred vision, double vision, ear pain, eye pain, hearing loss, nose bleeds, sore throat Cardiac:  No dizziness, chest pain or heaviness, chest tightness,edema, No JVD  Resp:   No cough, -sputum production, -shortness of breath,-wheezing, -hemoptysis,  Other:  All other systems negative   Physical Examination:   General Appearance: No distress  EYES PERRLA, EOM intact.   NECK Supple, No JVD Pulmonary: normal breath sounds, No wheezing.  CardiovascularNormal S1,S2.  No m/r/g.    Abdomen: Benign, Soft, non-tender. Neurology UE/LE 5/5 strength, no focal deficits Ext pulses intact, cap refill intact ALL OTHER ROS ARE NEGATIVE     ASSESSMENT AND PLAN SYNOPSIS 57 year old pleasant white female seen today for follow-up assessment for COPD diagnosed with home sleep study that showed AHI of 15 along with 366 episodes of snoring per hour  At this time I have reviewed in detail her sleep study Several options were discussed in terms of next plan of action At this time I recommend starting auto CPAP using nasal pillows I will prescribe auto CPAP 5-10 and have a follow-up assessment   Overweight -recommend significant weight loss -recommend changing diet  Deconditioned state -Recommend increased daily activity and exercise   MEDICATION ADJUSTMENTS/LABS AND TESTS ORDERED: Recommend starting auto CPAP 5-10 Recommend using nasal pillows   CURRENT MEDICATIONS REVIEWED AT LENGTH WITH PATIENT TODAY   Patient  satisfied with Plan of action and management. All questions answered  Follow up  3 months  Total Time Spent  35 mins   Wallis Bamberg Santiago Glad, M.D.  [facility]  Medical Director [facility] Medical Director [facility] Cardio-Pulmonary Department

## 2023-04-07 NOTE — Assessment & Plan Note (Addendum)
Blood pressure slightly elevated at current visit, but patient reports it has been well-controlled. -Continue current Amlodipine 2.5 mg daily -Continue Hyzaar 100-25 mg daily. -Check Cmet today -Encourage regular blood pressure monitoring at home.

## 2023-04-07 NOTE — Assessment & Plan Note (Signed)
Recent Calcium score 0.  Not interested in statin therapy at this time. -Check fasting lipids

## 2023-04-07 NOTE — Patient Instructions (Signed)
Start CPAP therapy with AUTO CPAP 5-10   We will start with Nasal Pillows  Can use Zyrtec as needed for allergies

## 2023-04-18 ENCOUNTER — Ambulatory Visit: Payer: Managed Care, Other (non HMO) | Admitting: Family Medicine

## 2023-04-30 ENCOUNTER — Other Ambulatory Visit: Payer: Self-pay

## 2023-04-30 DIAGNOSIS — E1159 Type 2 diabetes mellitus with other circulatory complications: Secondary | ICD-10-CM

## 2023-04-30 DIAGNOSIS — E785 Hyperlipidemia, unspecified: Secondary | ICD-10-CM

## 2023-04-30 MED ORDER — METFORMIN HCL ER 500 MG PO TB24: 1000.0000 mg | ORAL_TABLET | Freq: Every day | ORAL | 3 refills | Status: DC

## 2023-04-30 MED ORDER — ROSUVASTATIN CALCIUM 10 MG PO TABS: 10.0000 mg | ORAL_TABLET | Freq: Every day | ORAL | 3 refills | Status: DC

## 2023-07-08 ENCOUNTER — Ambulatory Visit: Payer: Managed Care, Other (non HMO) | Admitting: Internal Medicine

## 2023-07-08 NOTE — Progress Notes (Deleted)
 Name: Morgan Gonzalez MRN: [id number] DOB: 1966-05-11    CHIEF COMPLAINT:  Diagnosis of obstructive sleep apnea assessment Home sleep test July 2024 AHI of 17 snoring episodes 366/h   HISTORY OF PRESENT ILLNESS: Follow-up assessment for OSA Previous HST reviewed in detail with patient AHI of 17 snoring episodes 366/h Recent discussed risk of untreated sleep apnea including cardiac arrhthymias, stroke, DM, pulm HTN.   We discussed several sleep apnea therapy options 1 option we discussed was using a dental device however we will need repeat study to confirm if dental device is working  The traditional option is to start CPAP therapy with the machine and hose and nasal pillows I suggested we try auto CPAP 5-10   No evidence of heart failure at this time No evidence or signs of infection at this time No respiratory distress No fevers, chills, nausea, vomiting, diarrhea No evidence of lower extremity edema No evidence hemoptysis     PAST MEDICAL HISTORY :   has a past medical history of Allergic rhinitis, COVID-19, Hypertension, Hypertension, Pharyngitis (06/16/2015), and Vitamin D deficiency.  has a past surgical history that includes Cesarean section and Joint replacement. Prior to Admission medications   Medication Sig Start Date End Date Taking? Authorizing Provider  amLODipine (NORVASC) 2.5 MG tablet Take 1 tablet (2.5 mg total) by mouth daily. 01/25/22   McLean-Scocuzza, Pasty Spillers, MD  losartan-hydrochlorothiazide (HYZAAR) 100-25 MG tablet Take 1 tablet by mouth daily. D/c 50-12.5 mg qd 01/25/22   McLean-Scocuzza, Pasty Spillers, MD  metFORMIN (GLUCOPHAGE-XR) 500 MG 24 hr tablet Take 1 tablet (500 mg total) by mouth daily with breakfast. 01/25/22   McLean-Scocuzza, Pasty Spillers, MD   No Known Allergies  FAMILY HISTORY:  family history includes Brain cancer in her father; CAD in her mother; Diabetes in her mother; Hyperlipidemia in her father and mother; Hypertension in her father and mother;  Sleep apnea in her brother; Stroke in her mother. SOCIAL HISTORY:  reports that she has never smoked. She has never used smokeless tobacco. She reports current alcohol use of about 1.0 standard drink of alcohol per week. She reports that she does not use drugs.  LMP  (LMP Unknown)         Review of Systems: Gen:  Denies  fever, sweats, chills weight loss  HEENT: Denies blurred vision, double vision, ear pain, eye pain, hearing loss, nose bleeds, sore throat Cardiac:  No dizziness, chest pain or heaviness, chest tightness,edema, No JVD Resp:   No cough, -sputum production, -shortness of breath,-wheezing, -hemoptysis,  Other:  All other systems negative   Physical Examination:   General Appearance: No distress  EYES PERRLA, EOM intact.   NECK Supple, No JVD Pulmonary: normal breath sounds, No wheezing.  CardiovascularNormal S1,S2.  No m/r/g.   Abdomen: Benign, Soft, non-tender. Neurology UE/LE 5/5 strength, no focal deficits Ext pulses intact, cap refill intact ALL OTHER ROS ARE NEGATIVE     ASSESSMENT AND PLAN SYNOPSIS 58 year old pleasant white female seen today for follow-up assessment for COPD, also has a diagnosis of obstructive sleep apnea with AHI of 17 With morbid obesity and deconditioned state   Assessment of OSA     Assessment of COPD     Obesity -recommend significant weight loss -recommend changing diet  Deconditioned state -Recommend increased daily activity and exercise  diagnosed 3 months 30 AHI of 17          MEDICATION ADJUSTMENTS/LABS AND TESTS ORDERED: Recommend starting auto CPAP 5-10 Recommend using nasal pillows  CURRENT MEDICATIONS REVIEWED AT LENGTH WITH PATIENT TODAY   Patient  satisfied with Plan of action and management. All questions answered  Follow up  3 months  Total Time Spent  35 mins   Wallis Bamberg Santiago Glad, M.D.  [facility]  Medical Director [facility] Medical  Director [facility] Cardio-Pulmonary Department

## 2023-07-24 ENCOUNTER — Ambulatory Visit: Payer: Managed Care, Other (non HMO) | Admitting: Internal Medicine

## 2023-07-24 ENCOUNTER — Ambulatory Visit: Payer: 59 | Admitting: Internal Medicine

## 2023-07-24 ENCOUNTER — Encounter: Payer: Self-pay | Admitting: Internal Medicine

## 2023-07-24 VITALS — BP 122/84 | HR 92 | Ht 63.0 in | Wt 196.8 lb

## 2023-07-24 DIAGNOSIS — G4733 Obstructive sleep apnea (adult) (pediatric): Secondary | ICD-10-CM | POA: Diagnosis not present

## 2023-07-24 NOTE — Patient Instructions (Signed)
 Excellent Job A+ GOLD STAR!!  Continue CPAP as prescribed   Referral to DME company [id number]   Avoid Allergens and Irritants Avoid secondhand smoke Avoid SICK contacts Recommend  Masking  when appropriate Recommend Keep up-to-date with vaccinations   Be aware of reduced alertness and do not drive or operate heavy machinery if experiencing this or drowsiness.  Exercise encouraged, as tolerated. Encouraged proper weight management.  Important to get eight or more hours of sleep  Limiting the use of the computer and television before bedtime.  Decrease naps during the day, so night time sleep will become enhanced.  Limit caffeine, and sleep deprivation.    Recommend 10-12 pound weight loss in 6 months

## 2023-07-24 NOTE — Progress Notes (Signed)
 Name: Winston Sobczyk MRN: [id number] DOB: [birth year]    CHIEF COMPLAINT:  Diagnosis of obstructive sleep apnea assessment Home sleep test July 2024 AHI of 17 snoring episodes 366/h   HISTORY OF PRESENT ILLNESS: Patient seen and evaluated today for follow-up assessment for obstructive sleep apnea Sleep study reviewed in detail with patient AHI of 17 Snoring episodes 366/h  Discussed sleep data and reviewed with patient.  Encouraged proper weight management.  Discussed driving precautions and its relationship with hypersomnolence.  Discussed operating dangerous equipment and its relationship with hypersomnolence.  Discussed sleep hygiene, and benefits of a fixed sleep waked time.  The importance of getting eight or more hours of sleep discussed with patient.  Discussed limiting the use of the computer and television before bedtime.  Decrease naps during the day, so night time sleep will become enhanced.  Limit caffeine, and sleep deprivation.  HTN, stroke, and heart failure are potential risk factors.   Discussed risk of untreated sleep apnea including cardiac arrhthymias, stroke, DM, pulm HTN.    No exacerbation at this time No evidence of heart failure at this time No evidence or signs of infection at this time No respiratory distress No fevers, chills, nausea, vomiting, diarrhea No evidence of lower extremity edema No evidence hemoptysis'    PAST MEDICAL HISTORY :   has a past medical history of Allergic rhinitis, COVID-19, Hypertension, Hypertension, Pharyngitis (06/16/2015), and Vitamin D deficiency.  has a past surgical history that includes Cesarean section and Joint replacement. Prior to Admission medications   Medication Sig Start Date End Date Taking? Authorizing Provider  amLODipine (NORVASC) 2.5 MG tablet Take 1 tablet (2.5 mg total) by mouth daily. 01/25/22   McLean-Scocuzza, Pasty Spillers, MD  losartan-hydrochlorothiazide (HYZAAR) 100-25 MG tablet Take 1 tablet by  mouth daily. D/c 50-12.5 mg qd 01/25/22   McLean-Scocuzza, Pasty Spillers, MD  metFORMIN (GLUCOPHAGE-XR) 500 MG 24 hr tablet Take 1 tablet (500 mg total) by mouth daily with breakfast. 01/25/22   McLean-Scocuzza, Pasty Spillers, MD   No Known Allergies  FAMILY HISTORY:  family history includes Brain cancer in her father; CAD in her mother; Diabetes in her mother; Hyperlipidemia in her father and mother; Hypertension in her father and mother; Sleep apnea in her brother; Stroke in her mother. SOCIAL HISTORY:  reports that she has never smoked. She has never used smokeless tobacco. She reports current alcohol use of about 1.0 standard drink of alcohol per week. She reports that she does not use drugs.      BP 122/84 (BP Location: Left Arm, Patient Position: Sitting, Cuff Size: Normal)   Pulse 92   Ht 5\' 3"  (1.6 m)   Wt 196 lb 12.8 oz (89.3 kg)   LMP  (LMP Unknown)   SpO2 96%   BMI 34.86 kg/m     Review of Systems: Gen:  Denies  fever, sweats, chills weight loss  HEENT: Denies blurred vision, double vision, ear pain, eye pain, hearing loss, nose bleeds, sore throat Cardiac:  No dizziness, chest pain or heaviness, chest tightness,edema, No JVD Resp:   No cough, -sputum production, -shortness of breath,-wheezing, -hemoptysis,  Other:  All other systems negative   Physical Examination:   General Appearance: No distress  EYES PERRLA, EOM intact.   NECK Supple, No JVD Pulmonary: normal breath sounds, No wheezing.  CardiovascularNormal S1,S2.  No m/r/g.   Abdomen: Benign, Soft, non-tender. Neurology UE/LE 5/5 strength, no focal deficits Ext pulses intact, cap refill intact ALL OTHER ROS ARE NEGATIVE  ASSESSMENT AND PLAN SYNOPSIS 58 year old pleasant white female seen today for follow-up assessment for COPD diagnosed with home sleep study that showed AHI of 15 along with 366 episodes of snoring per hour  Assessment of Sleep apnea Patient has excellent compliance report Discussed in  detail with patient OSA is well-controlled with CPAP Continue current prescription Continue auto CPAP 5-15 as prescribed Plan for nasal cradle mask AHI reduced to 2.4 More energy less fatigue Patient use and benefits from therapy   Patient Instructions Continue to use CPAP every night, minimum of 4-6 hours a night.  Change equipment every 30 days or as directed by DME.  Wash your tubing with warm soap and water daily, hang to dry. Wash humidifier portion weekly. Use bottled, distilled water and change daily   Be aware of reduced alertness and do not drive or operate heavy machinery if experiencing this or drowsiness.  Exercise encouraged, as tolerated. Encouraged proper weight management.  Important to get eight or more hours of sleep  Limiting the use of the computer and television before bedtime.  Decrease naps during the day, so night time sleep will become enhanced.  Limit caffeine, and sleep deprivation.  HTN, stroke, uncontrolled diabetes and heart failure are potential risk factors.  Risk of untreated sleep apnea including cardiac arrhthymias, stroke, DM, pulm HTN.    Overweight -recommend significant weight loss -recommend changing diet  Deconditioned state -Recommend increased daily activity and exercise  Hypertension - Sleep apnea can contribute to hypertension, therefore treatment of sleep apnea is important part of hypertension management.  Diabetes Mellitis - Sleep apnea can contribute to DM, therefore treatment of sleep apnea is important part of DM management.    MEDICATION ADJUSTMENTS/LABS AND TESTS ORDERED: Continue CPAP as prescribed Referral to DME company [employer] CRADLE RESMED [id number] MASK Avoid Allergens and Irritants Avoid secondhand smoke Avoid SICK contacts Recommend  Masking  when appropriate Recommend Keep up-to-date with vaccinations Recommend 10-12 pound weight loss in 6 months   CURRENT MEDICATIONS REVIEWED AT LENGTH WITH PATIENT  TODAY   Patient  satisfied with Plan of action and management. All questions answered  Follow up in 6 months  Total Time Spent  42 mins   Wallis Bamberg Santiago Glad, M.D.  [facility]  Medical Director [facility] Medical Director [facility] Cardio-Pulmonary Department

## 2023-07-24 NOTE — Progress Notes (Deleted)
 Name: Morgan Gonzalez MRN: [id number] DOB: March 12, 1966    CHIEF COMPLAINT:  Diagnosis of obstructive sleep apnea assessment Home sleep test July 2024 AHI of 17 snoring episodes 366/h   HISTORY OF PRESENT ILLNESS: Patient seen and evaluated today for follow-up assessment for obstructive sleep apnea Sleep study reviewed in detail with patient AHI of 17 Snoring episodes 366/h  Patient has extreme fatigue Lack of energy Feels exhausted throughout the day Has morning headaches with nasal congestion    Discussed sleep data and reviewed with patient.  Encouraged proper weight management.  Discussed driving precautions and its relationship with hypersomnolence.  Discussed operating dangerous equipment and its relationship with hypersomnolence.  Discussed sleep hygiene, and benefits of a fixed sleep waked time.  The importance of getting eight or more hours of sleep discussed with patient.  Discussed limiting the use of the computer and television before bedtime.  Decrease naps during the day, so night time sleep will become enhanced.  Limit caffeine, and sleep deprivation.  HTN, stroke, and heart failure are potential risk factors.   Discussed risk of untreated sleep apnea including cardiac arrhthymias, stroke, DM, pulm HTN.   Discussed risk of untreated sleep apnea including cardiac arrhthymias, stroke, DM, pulm HTN.   We discussed several sleep apnea therapy options 1 option we discussed was using a dental device however we will need repeat study to confirm if dental device is working  The traditional option is to start CPAP therapy with the machine and hose and nasal pillows I suggested we try auto CPAP 5-10   No evidence of heart failure at this time No evidence or signs of infection at this time No respiratory distress No fevers, chills, nausea, vomiting, diarrhea No evidence of lower extremity edema No evidence hemoptysis    PAST MEDICAL HISTORY :   has a past medical  history of Allergic rhinitis, COVID-19, Hypertension, Hypertension, Pharyngitis (06/16/2015), and Vitamin D deficiency.  has a past surgical history that includes Cesarean section and Joint replacement. Prior to Admission medications   Medication Sig Start Date End Date Taking? Authorizing Provider  amLODipine (NORVASC) 2.5 MG tablet Take 1 tablet (2.5 mg total) by mouth daily. 01/25/22   McLean-Scocuzza, Pasty Spillers, MD  losartan-hydrochlorothiazide (HYZAAR) 100-25 MG tablet Take 1 tablet by mouth daily. D/c 50-12.5 mg qd 01/25/22   McLean-Scocuzza, Pasty Spillers, MD  metFORMIN (GLUCOPHAGE-XR) 500 MG 24 hr tablet Take 1 tablet (500 mg total) by mouth daily with breakfast. 01/25/22   McLean-Scocuzza, Pasty Spillers, MD   No Known Allergies  FAMILY HISTORY:  family history includes Brain cancer in her father; CAD in her mother; Diabetes in her mother; Hyperlipidemia in her father and mother; Hypertension in her father and mother; Sleep apnea in her brother; Stroke in her mother. SOCIAL HISTORY:  reports that she has never smoked. She has never used smokeless tobacco. She reports current alcohol use of about 1.0 standard drink of alcohol per week. She reports that she does not use drugs.  LMP  (LMP Unknown)       Review of Systems: Gen:  Denies  fever, sweats, chills weight loss  HEENT: Denies blurred vision, double vision, ear pain, eye pain, hearing loss, nose bleeds, sore throat Cardiac:  No dizziness, chest pain or heaviness, chest tightness,edema, No JVD Resp:   No cough, -sputum production, -shortness of breath,-wheezing, -hemoptysis,  Other:  All other systems negative   Physical Examination:   General Appearance: No distress  EYES PERRLA, EOM intact.  NECK Supple, No JVD Pulmonary: normal breath sounds, No wheezing.  CardiovascularNormal S1,S2.  No m/r/g.   Abdomen: Benign, Soft, non-tender. Neurology UE/LE 5/5 strength, no focal deficits Ext pulses intact, cap refill intact ALL OTHER ROS ARE  NEGATIVE    ASSESSMENT AND PLAN  58 year old pleasant white female seen today for follow-up assessment for COPD diagnosed with home sleep study that showed AHI of 17 along with 366 episodes of snoring per hour  At this time I have reviewed in detail her sleep study Several options were discussed in terms of next plan of action At this time I recommend starting auto CPAP using nasal pillows I will prescribe auto CPAP 5-10 and have a follow-up assessment  Overweight -recommend significant weight loss -recommend changing diet  Deconditioned state -Recommend increased daily activity and exercise   MEDICATION ADJUSTMENTS/LABS AND TESTS ORDERED: Recommend starting auto CPAP 5-10 Recommend using nasal pillows   CURRENT MEDICATIONS REVIEWED AT LENGTH WITH PATIENT TODAY   Patient  satisfied with Plan of action and management. All questions answered  Follow up  3 months  Total Time Spent 42   Rayden Scheper Santiago Glad, M.D.  [facility]  Medical Director [facility] Medical Director [facility] Cardio-Pulmonary Department

## 2023-09-05 ENCOUNTER — Telehealth: Payer: Self-pay | Admitting: Family Medicine

## 2023-09-05 NOTE — Telephone Encounter (Signed)
 LMOM for pt to call back and schedule a TOC visit.   E2C2, if pt calls back, please schedule a TOC. [facility]

## 2023-10-21 ENCOUNTER — Ambulatory Visit: Admitting: Dermatology

## 2023-10-22 ENCOUNTER — Ambulatory Visit: Payer: Managed Care, Other (non HMO) | Admitting: Dermatology

## 2023-11-13 ENCOUNTER — Other Ambulatory Visit: Payer: Self-pay | Admitting: Family Medicine

## 2023-11-13 DIAGNOSIS — E1159 Type 2 diabetes mellitus with other circulatory complications: Secondary | ICD-10-CM

## 2023-11-13 DIAGNOSIS — I10 Essential (primary) hypertension: Secondary | ICD-10-CM

## 2023-11-17 ENCOUNTER — Telehealth: Payer: Self-pay | Admitting: Family Medicine

## 2023-11-17 ENCOUNTER — Other Ambulatory Visit: Payer: Self-pay | Admitting: Family Medicine

## 2023-11-17 ENCOUNTER — Telehealth: Payer: Self-pay

## 2023-11-17 DIAGNOSIS — E1159 Type 2 diabetes mellitus with other circulatory complications: Secondary | ICD-10-CM

## 2023-11-17 DIAGNOSIS — E1169 Type 2 diabetes mellitus with other specified complication: Secondary | ICD-10-CM

## 2023-11-17 DIAGNOSIS — E118 Type 2 diabetes mellitus with unspecified complications: Secondary | ICD-10-CM

## 2023-11-17 MED ORDER — METFORMIN HCL ER 500 MG PO TB24: 1000.0000 mg | ORAL_TABLET | Freq: Every day | ORAL | 1 refills | Status: DC

## 2023-11-17 MED ORDER — LOSARTAN POTASSIUM-HCTZ 100-25 MG PO TABS: 1.0000 | ORAL_TABLET | Freq: Every day | ORAL | 1 refills | Status: DC

## 2023-11-17 MED ORDER — ROSUVASTATIN CALCIUM 10 MG PO TABS: 10.0000 mg | ORAL_TABLET | Freq: Every day | ORAL | 1 refills | Status: DC

## 2023-11-17 MED ORDER — AMLODIPINE BESYLATE 2.5 MG PO TABS: 2.5000 mg | ORAL_TABLET | Freq: Every day | ORAL | 1 refills | Status: DC

## 2023-11-17 NOTE — Telephone Encounter (Signed)
Toc needed

## 2023-11-17 NOTE — Telephone Encounter (Signed)
 Left message to call office to schedule a TOC with eithr Leron Glance, or [employer].  E2C2 please schedule

## 2024-04-15 ENCOUNTER — Ambulatory Visit: Payer: Self-pay | Admitting: Family

## 2024-04-15 ENCOUNTER — Encounter: Payer: Self-pay | Admitting: Family

## 2024-04-15 VITALS — BP 124/80 | HR 91 | Temp 98.0°F | Ht 63.0 in | Wt 195.4 lb

## 2024-04-15 DIAGNOSIS — E118 Type 2 diabetes mellitus with unspecified complications: Secondary | ICD-10-CM | POA: Diagnosis not present

## 2024-04-15 DIAGNOSIS — E1159 Type 2 diabetes mellitus with other circulatory complications: Secondary | ICD-10-CM

## 2024-04-15 DIAGNOSIS — R7309 Other abnormal glucose: Secondary | ICD-10-CM

## 2024-04-15 DIAGNOSIS — I152 Hypertension secondary to endocrine disorders: Secondary | ICD-10-CM

## 2024-04-15 LAB — POCT GLYCOSYLATED HEMOGLOBIN (HGB A1C): Hemoglobin A1C: 6.6 % — AB (ref 4.0–5.6)

## 2024-04-15 LAB — LIPID PANEL
Cholesterol: 200 mg/dL (ref 0–200)
HDL: 39.3 mg/dL (ref >39.00)
LDL Cholesterol: 85 mg/dL (ref 0–99)
NonHDL: 160.28
Total CHOL/HDL Ratio: 5
Triglycerides: 378 mg/dL — ABNORMAL HIGH (ref 0.0–149.0)
VLDL: 75.6 mg/dL — ABNORMAL HIGH (ref 0.0–40.0)

## 2024-04-15 LAB — MICROALBUMIN / CREATININE URINE RATIO
Creatinine,U: 120.3 mg/dL
Microalb Creat Ratio: 6.4 mg/g (ref 0.0–30.0)
Microalb, Ur: 0.8 mg/dL (ref 0.0–1.9)

## 2024-04-15 LAB — COMPREHENSIVE METABOLIC PANEL WITH GFR
ALT: 36 U/L — ABNORMAL HIGH (ref 0–35)
AST: 16 U/L (ref 0–37)
Albumin: 3.9 g/dL (ref 3.5–5.2)
Alkaline Phosphatase: 53 U/L (ref 39–117)
BUN: 24 mg/dL — ABNORMAL HIGH (ref 6–23)
CO2: 28 meq/L (ref 19–32)
Calcium: 9.3 mg/dL (ref 8.4–10.5)
Chloride: 105 meq/L (ref 96–112)
Creatinine, Ser: 0.68 mg/dL (ref 0.40–1.20)
GFR: 97.2 mL/min (ref >60.00)
Glucose, Bld: 107 mg/dL — ABNORMAL HIGH (ref 70–99)
Potassium: 3.8 meq/L (ref 3.5–5.1)
Sodium: 139 meq/L (ref 135–145)
Total Bilirubin: 0.3 mg/dL (ref 0.2–1.2)
Total Protein: 7.2 g/dL (ref 6.0–8.3)

## 2024-04-15 LAB — TSH: TSH: 0.77 u[IU]/mL (ref 0.35–5.50)

## 2024-04-15 MED ORDER — LOSARTAN POTASSIUM-HCTZ 100-25 MG PO TABS: 1.0000 | ORAL_TABLET | Freq: Every day | ORAL | 0 refills | Status: AC

## 2024-04-15 MED ORDER — AMLODIPINE BESYLATE 2.5 MG PO TABS: 2.5000 mg | ORAL_TABLET | Freq: Every day | ORAL | 0 refills | Status: AC

## 2024-04-15 NOTE — Patient Instructions (Signed)
 Goal of fasting blood sugar is between 70-120. If in this range, we are reaching our target a1c ( goal 6.5%)   Please check fasting blood sugar in the morning time once or twice a week.  You may also check if you feel like you are having a low episode or particularly high episode of blood sugar.  If blood sugars increase, I may advise you to check blood sugar after your largest meal.  You specifically do this TWO hours after largest meal with the goal of being less than 180.  If blood sugar is checked sooner than 2 hours after largest meal, and it will be  expected to be elevated. You must wait 2 hours.   If your blood sugar is less than 180 hours after your largest meal, again we are reaching our target a1c goal   Call [facility] clinic if: BG < 70 or > 300.   If you have any symptoms of low blood sugar ( sweating, shakiness, lightheaded, dizzy) that you notify me. If you have a low, please drink a glass of orange juice and recheck blood sugar every 5 minutes until you don't feel symptomatic AND blood sugar is above 80.    This is  Dr. Lula  example of a  Low GI  Diet:  It will allow you to lose 4 to 8  lbs  per month if you follow it carefully.  Your goal with exercise is a minimum of 30 minutes of aerobic exercise 5 days per week (Walking does not count once it becomes easy!)    All of the foods can be found at grocery stores and in bulk at [employer].  The Atkins protein bars and shakes are available in more varieties at Target, WalMart and [employer]'s Foods.     7 AM Breakfast:  Choose from the following:  Low carbohydrate Protein  Shakes (I recommend the  Premier Protein chocolate shakes,  EAS AdvantEdge Carb Control shakes  Or the Atkins shakes all are under 3 net carbs)     a scrambled egg/bacon/cheese burrito made with Mission's carb balance whole wheat tortilla  (about 10 net carbs )  Medical Laboratory Scientific Officer (basically a quiche without the pastry crust) that  is eaten cold and very convenient way to get your eggs.  8 carbs)  If you make your own protein shakes, avoid bananas and pineapple,  And use low carb greek yogurt or original /unsweetened almond or soy milk    Avoid cereal and bananas, oatmeal and cream of wheat and grits. They are loaded with carbohydrates!   10 AM: high protein snack:  Protein bar by Atkins (the snack size, under 200 cal, usually < 6 net carbs).    A stick of cheese:  Around 1 carb,  100 cal     Dannon Light n Fit Greek Yogurt  (80 cal, 8 carbs)  Other so called protein bars and Greek yogurts tend to be loaded with carbohydrates.  Remember, in food advertising, the word energy is synonymous for  carbohydrate.  Lunch:   A Sandwich using the bread choices listed, Can use any  Eggs,  lunchmeat, grilled meat or canned tuna), avocado, regular mayo/mustard  and cheese.  A Salad using blue cheese, ranch,  Goddess or vinagrette,  Avoid taco shells, croutons or confetti and no candied nuts but regular nuts OK.   No pretzels, nabs  or chips.  Pickles and miniature sweet peppers are a good low  carb alternative that provide a crunch  The bread is the only source of carbohydrate in a sandwich and  can be decreased by trying some of the attached alternatives to traditional loaf bread   Avoid Low fat dressings, as well as [place] and [address] dressings They are loaded with sugar!   3 PM/ Mid day  Snack:  Consider  1 ounce of  almonds, walnuts, pistachios, pecans, peanuts,  Macadamia nuts or a nut medley.  Avoid granola and granola bars   Mixed nuts are ok in moderation as long as there are no raisins,  cranberries or dried fruit.   KIND bars are OK if you get the low glycemic index variety   Try the prosciutto/mozzarella cheese sticks by F[employer]  In deli /backery section   High protein      6 PM  Dinner:     Meat/fowl/fish with a green salad, and either broccoli, cauliflower, green beans, spinach, brussel  sprouts or  Lima beans. DO NOT BREAD THE PROTEIN!!      There is a low carb pasta by Dreamfield's that is acceptable and tastes great: only 5 digestible carbs/serving.( All grocery stores but BJs carry it ) Several ready made meals are available low carb:   Try Michel Angelo's chicken piccata or chicken or eggplant parm over low carb pasta.(Lowes and BJs)   [place] Carnitas (pulled pork, no sauce,  0 carbs) or his beef pot roast to make a dinner burrito (at [employer])  Pesto over low carb pasta (bj's sells a good quality pesto in the center refrigerated section of the deli   Try satueeing  Glenna Blonder with mushroooms as a good side   [employer] Giant makes a mashed cauliflower that tastes like mashed potatoes  Whole wheat pasta is still full of digestible carbs and  Not as low in glycemic index as Dreamfield's.   Brown rice is still rice,  So skip the rice and noodles if you eat Chinese or Thai (or at least limit to 1/2 cup)  9 PM snack :   Breyer's low carb fudgsicle or  ice cream bar (Carb Smart line), or  Weight Watcher's ice cream bar , or another no sugar added ice cream;  a serving of fresh berries/cherries with whipped cream   Cheese or DANNON'S LlGHT N FIT GREEK YOGURT  8 ounces of [place] unsweetened almond/cococunut milk    Treat yourself to a parfait made with whipped cream blueberiies, walnuts and vanilla greek yogurt  Avoid bananas, pineapple, grapes  and watermelon on a regular basis because they are high in sugar.  THINK OF THEM AS DESSERT  Remember that snack Substitutions should be less than 10 NET carbs per serving and meals < 20 carbs. Remember to subtract fiber grams to get the net carbs.

## 2024-04-15 NOTE — Progress Notes (Signed)
 Assessment & Plan:  Type 2 diabetes mellitus with complications [name]) Assessment & Plan: Chronic, stable.  She is no longer taking metformin  due to diarrhea.  Discussed lifestyle management and the importance of close follow-up in 3 months time.  Provided low glycemic diet.  Advised to check fasting glucose from time to time to ensure blood sugar not escalating.  Orders: -     Comprehensive metabolic panel with GFR -     Lipid panel -     TSH -     Microalbumin / creatinine urine ratio  Elevated glucose -     POCT glycosylated hemoglobin (Hb A1C)  Hypertension associated with diabetes (HCC) Assessment & Plan: Chronic, stable.  Continue losartan /hydrochlorothiazide 100-25mg  and amlodipine  2.5mg .   Orders: -     Losartan  Potassium-HCTZ; Take 1 tablet by mouth daily. D/c 50-12.5 mg qd  Dispense: 90 tablet; Refill: 0 -     amLODIPine  Besylate; Take 1 tablet (2.5 mg total) by mouth daily.  Dispense: 90 tablet; Refill: 0     Return precautions given.   Risks, benefits, and alternatives of the medications and treatment plan prescribed today were discussed, and patient expressed understanding.   Education regarding symptom management and diagnosis given to patient on AVS either electronically or printed.  Return in about 3 months (around 07/16/2024).  Rollene Northern, FNP  Subjective:    Patient ID: Morgan Gonzalez, female    DOB: 05-23-1966, 58 y.o.   MRN: [id number]  CC: Morgan Gonzalez is a 58 y.o. female who presents today for an acute visit.    HPI: HPI Discussed the use of AI scribe software for clinical note transcription with the patient, who gave verbal consent to proceed.  History of Present Illness   Morgan Gonzalez is a 58 year old female with diabetes who presents for diabetes management.  She manages her diabetes primarily through lifestyle modifications, including dietary changes such as reducing sugar and dessert intake. Her most recent A1c is 6.6, slightly elevated from  6.3 two years ago. She was previously on metformin  but discontinued it due to severe diarrhea. She also tried Ozempic  but stopped due to nausea and her husband's concerns. She is interested in monitoring her blood sugar levels at home and is considering using a glucometer.  She works as the radiographer, therapeutic at [employer]. She is making efforts to control her portions and improve her diet.   Her past medical history includes hypertension, for which she takes losartan /hydrochlorothiazide and amlodipine . She does not currently take Crestor  for cholesterol management.  Denies chest pain, shortness of breath  She has a scheduled mammogram and OB GYN appointment.   Follows with GYN in [place], Dr Rutherford.  Mammogram is scheduled.   Allergies: Metformin  and related Current Outpatient Medications on File Prior to Visit  Medication Sig Dispense Refill   Cholecalciferol 125 MCG (5000 UT) TABS Take 5,000 Units by mouth daily.     Cyanocobalamin  (VITAMIN B-12 PO) Take 1 tablet by mouth.     fluticasone  (FLONASE ) 50 MCG/ACT nasal spray Place 2 sprays into both nostrils daily. 16 g 6   Magnesium 300 MG CAPS Take 1 capsule by mouth daily in the afternoon.     No current facility-administered medications on file prior to visit.    Review of Systems  Constitutional:  Negative for chills and fever.  Respiratory:  Negative for cough.   Cardiovascular:  Negative for chest pain and palpitations.  Gastrointestinal:  Negative for nausea and vomiting.  Objective:    BP 124/80   Pulse 91   Temp 98 F (36.7 C) (Oral)   Ht 5' 3 (1.6 m)   Wt 195 lb 6.4 oz (88.6 kg)   LMP  (LMP Unknown)   SpO2 95%   BMI 34.61 kg/m   BP Readings from Last 3 Encounters:  04/15/24 124/80  07/24/23 122/84  04/07/23 116/80   Wt Readings from Last 3 Encounters:  04/15/24 195 lb 6.4 oz (88.6 kg)  07/24/23 196 lb 12.8 oz (89.3 kg)  04/07/23 193 lb (87.5 kg)    Physical Exam Vitals  reviewed.  Constitutional:      Appearance: She is well-developed.  Eyes:     Conjunctiva/sclera: Conjunctivae normal.  Cardiovascular:     Rate and Rhythm: Normal rate and regular rhythm.     Pulses: Normal pulses.     Heart sounds: Normal heart sounds.  Pulmonary:     Effort: Pulmonary effort is normal.     Breath sounds: Normal breath sounds. No wheezing, rhonchi or rales.  Skin:    General: Skin is warm and dry.  Neurological:     Mental Status: She is alert.  Psychiatric:        Speech: Speech normal.        Behavior: Behavior normal.        Thought Content: Thought content normal.

## 2024-04-17 NOTE — Assessment & Plan Note (Signed)
 Chronic, stable.  Continue losartan /hydrochlorothiazide 100-25mg  and amlodipine  2.5mg .

## 2024-04-17 NOTE — Assessment & Plan Note (Addendum)
 Chronic, stable.  She is no longer taking metformin  due to diarrhea.  Discussed lifestyle management and the importance of close follow-up in 3 months time.  Provided low glycemic diet.  Advised to check fasting glucose from time to time to ensure blood sugar not escalating.

## 2024-04-28 ENCOUNTER — Ambulatory Visit: Payer: Self-pay | Admitting: Family

## 2024-07-22 ENCOUNTER — Ambulatory Visit: Admitting: Family

## 2024-12-23 ENCOUNTER — Encounter: Admitting: Family
